# Patient Record
Sex: Female | Born: 1965 | Race: Black or African American | Hispanic: No | Smoking: Never smoker
Health system: Southern US, Community
[De-identification: ages and names within clinical notes are randomized; demographics above are authoritative.]

## PROBLEM LIST (undated history)

## (undated) DIAGNOSIS — D649 Anemia, unspecified: Secondary | ICD-10-CM

## (undated) DIAGNOSIS — B20 Human immunodeficiency virus [HIV] disease: Secondary | ICD-10-CM

## (undated) DIAGNOSIS — E079 Disorder of thyroid, unspecified: Secondary | ICD-10-CM

## (undated) DIAGNOSIS — Z21 Asymptomatic human immunodeficiency virus [HIV] infection status: Secondary | ICD-10-CM

## (undated) HISTORY — DX: Anemia, unspecified: D64.9

## (undated) HISTORY — DX: Disorder of thyroid, unspecified: E07.9

## (undated) HISTORY — DX: Human immunodeficiency virus (HIV) disease: B20

## (undated) HISTORY — DX: Asymptomatic human immunodeficiency virus (hiv) infection status: Z21

---

## 1998-05-15 ENCOUNTER — Other Ambulatory Visit: Admission: RE | Admit: 1998-05-15 | Discharge: 1998-05-15 | Payer: Self-pay | Admitting: Obstetrics & Gynecology

## 1998-11-28 ENCOUNTER — Inpatient Hospital Stay (HOSPITAL_COMMUNITY): Admission: AD | Admit: 1998-11-28 | Discharge: 1998-12-01 | Payer: Self-pay | Admitting: Obstetrics & Gynecology

## 1999-01-18 ENCOUNTER — Other Ambulatory Visit: Admission: RE | Admit: 1999-01-18 | Discharge: 1999-01-18 | Payer: Self-pay | Admitting: Obstetrics & Gynecology

## 1999-03-25 ENCOUNTER — Other Ambulatory Visit: Admission: RE | Admit: 1999-03-25 | Discharge: 1999-03-25 | Payer: Self-pay | Admitting: Obstetrics & Gynecology

## 1999-03-25 ENCOUNTER — Encounter (INDEPENDENT_AMBULATORY_CARE_PROVIDER_SITE_OTHER): Payer: Self-pay

## 1999-07-13 ENCOUNTER — Other Ambulatory Visit: Admission: RE | Admit: 1999-07-13 | Discharge: 1999-07-13 | Payer: Self-pay | Admitting: Obstetrics & Gynecology

## 2000-08-28 ENCOUNTER — Other Ambulatory Visit: Admission: RE | Admit: 2000-08-28 | Discharge: 2000-08-28 | Payer: Self-pay | Admitting: Obstetrics & Gynecology

## 2001-09-05 ENCOUNTER — Other Ambulatory Visit: Admission: RE | Admit: 2001-09-05 | Discharge: 2001-09-05 | Payer: Self-pay | Admitting: Obstetrics & Gynecology

## 2001-12-07 DIAGNOSIS — B20 Human immunodeficiency virus [HIV] disease: Secondary | ICD-10-CM

## 2001-12-18 ENCOUNTER — Ambulatory Visit (HOSPITAL_COMMUNITY): Admission: RE | Admit: 2001-12-18 | Discharge: 2001-12-18 | Payer: Self-pay | Admitting: Infectious Diseases

## 2001-12-18 ENCOUNTER — Encounter: Payer: Self-pay | Admitting: Infectious Diseases

## 2001-12-18 ENCOUNTER — Encounter: Admission: RE | Admit: 2001-12-18 | Discharge: 2001-12-18 | Payer: Self-pay | Admitting: Infectious Diseases

## 2001-12-31 ENCOUNTER — Encounter (INDEPENDENT_AMBULATORY_CARE_PROVIDER_SITE_OTHER): Payer: Self-pay | Admitting: *Deleted

## 2002-01-02 ENCOUNTER — Encounter: Admission: RE | Admit: 2002-01-02 | Discharge: 2002-01-02 | Payer: Self-pay | Admitting: Infectious Diseases

## 2002-02-06 ENCOUNTER — Encounter: Admission: RE | Admit: 2002-02-06 | Discharge: 2002-02-06 | Payer: Self-pay | Admitting: Infectious Diseases

## 2002-02-22 ENCOUNTER — Encounter: Admission: RE | Admit: 2002-02-22 | Discharge: 2002-02-22 | Payer: Self-pay | Admitting: Infectious Diseases

## 2002-03-11 ENCOUNTER — Ambulatory Visit (HOSPITAL_COMMUNITY): Admission: RE | Admit: 2002-03-11 | Discharge: 2002-03-11 | Payer: Self-pay | Admitting: Infectious Diseases

## 2002-03-11 ENCOUNTER — Encounter: Admission: RE | Admit: 2002-03-11 | Discharge: 2002-03-11 | Payer: Self-pay | Admitting: Infectious Diseases

## 2002-03-20 ENCOUNTER — Encounter: Admission: RE | Admit: 2002-03-20 | Discharge: 2002-03-20 | Payer: Self-pay | Admitting: Internal Medicine

## 2002-03-21 ENCOUNTER — Ambulatory Visit (HOSPITAL_COMMUNITY): Admission: RE | Admit: 2002-03-21 | Discharge: 2002-03-21 | Payer: Self-pay | Admitting: Infectious Diseases

## 2002-03-21 ENCOUNTER — Encounter: Payer: Self-pay | Admitting: Infectious Diseases

## 2002-03-27 ENCOUNTER — Encounter: Admission: RE | Admit: 2002-03-27 | Discharge: 2002-03-27 | Payer: Self-pay | Admitting: Infectious Diseases

## 2002-04-03 ENCOUNTER — Encounter: Admission: RE | Admit: 2002-04-03 | Discharge: 2002-04-03 | Payer: Self-pay | Admitting: Infectious Diseases

## 2002-04-17 ENCOUNTER — Encounter: Admission: RE | Admit: 2002-04-17 | Discharge: 2002-04-17 | Payer: Self-pay | Admitting: Infectious Diseases

## 2002-04-23 ENCOUNTER — Encounter: Admission: RE | Admit: 2002-04-23 | Discharge: 2002-04-23 | Payer: Self-pay | Admitting: Internal Medicine

## 2002-04-23 ENCOUNTER — Ambulatory Visit (HOSPITAL_COMMUNITY): Admission: RE | Admit: 2002-04-23 | Discharge: 2002-04-23 | Payer: Self-pay | Admitting: Infectious Diseases

## 2002-06-10 ENCOUNTER — Ambulatory Visit (HOSPITAL_COMMUNITY): Admission: RE | Admit: 2002-06-10 | Discharge: 2002-06-10 | Payer: Self-pay | Admitting: Infectious Diseases

## 2002-06-10 ENCOUNTER — Encounter: Admission: RE | Admit: 2002-06-10 | Discharge: 2002-06-10 | Payer: Self-pay | Admitting: Infectious Diseases

## 2002-07-10 ENCOUNTER — Encounter: Admission: RE | Admit: 2002-07-10 | Discharge: 2002-07-10 | Payer: Self-pay | Admitting: Infectious Diseases

## 2002-07-10 ENCOUNTER — Ambulatory Visit (HOSPITAL_COMMUNITY): Admission: RE | Admit: 2002-07-10 | Discharge: 2002-07-10 | Payer: Self-pay | Admitting: Infectious Diseases

## 2002-09-12 ENCOUNTER — Encounter: Admission: RE | Admit: 2002-09-12 | Discharge: 2002-09-12 | Payer: Self-pay | Admitting: Infectious Diseases

## 2002-11-13 ENCOUNTER — Encounter: Payer: Self-pay | Admitting: Infectious Diseases

## 2002-11-13 ENCOUNTER — Encounter: Admission: RE | Admit: 2002-11-13 | Discharge: 2002-11-13 | Payer: Self-pay | Admitting: Infectious Diseases

## 2002-12-03 ENCOUNTER — Encounter: Payer: Self-pay | Admitting: Infectious Diseases

## 2002-12-03 ENCOUNTER — Encounter: Admission: RE | Admit: 2002-12-03 | Discharge: 2002-12-03 | Payer: Self-pay | Admitting: Infectious Diseases

## 2003-01-01 ENCOUNTER — Encounter: Admission: RE | Admit: 2003-01-01 | Discharge: 2003-01-01 | Payer: Self-pay | Admitting: Infectious Diseases

## 2003-01-03 ENCOUNTER — Encounter: Payer: Self-pay | Admitting: Infectious Diseases

## 2003-01-03 ENCOUNTER — Ambulatory Visit (HOSPITAL_COMMUNITY): Admission: RE | Admit: 2003-01-03 | Discharge: 2003-01-03 | Payer: Self-pay | Admitting: Infectious Diseases

## 2003-02-12 ENCOUNTER — Ambulatory Visit (HOSPITAL_COMMUNITY): Admission: RE | Admit: 2003-02-12 | Discharge: 2003-02-12 | Payer: Self-pay | Admitting: Infectious Diseases

## 2003-02-12 ENCOUNTER — Encounter: Payer: Self-pay | Admitting: Infectious Diseases

## 2003-02-12 ENCOUNTER — Encounter: Admission: RE | Admit: 2003-02-12 | Discharge: 2003-02-12 | Payer: Self-pay | Admitting: Infectious Diseases

## 2003-05-14 ENCOUNTER — Encounter: Payer: Self-pay | Admitting: Infectious Diseases

## 2003-05-14 ENCOUNTER — Encounter: Admission: RE | Admit: 2003-05-14 | Discharge: 2003-05-14 | Payer: Self-pay | Admitting: Infectious Diseases

## 2003-05-14 ENCOUNTER — Ambulatory Visit (HOSPITAL_COMMUNITY): Admission: RE | Admit: 2003-05-14 | Discharge: 2003-05-14 | Payer: Self-pay | Admitting: Infectious Diseases

## 2003-08-27 ENCOUNTER — Encounter: Payer: Self-pay | Admitting: Infectious Diseases

## 2003-08-27 ENCOUNTER — Encounter: Admission: RE | Admit: 2003-08-27 | Discharge: 2003-08-27 | Payer: Self-pay | Admitting: Infectious Diseases

## 2003-12-23 ENCOUNTER — Other Ambulatory Visit: Admission: RE | Admit: 2003-12-23 | Discharge: 2003-12-23 | Payer: Self-pay | Admitting: Obstetrics and Gynecology

## 2003-12-31 ENCOUNTER — Ambulatory Visit (HOSPITAL_COMMUNITY): Admission: RE | Admit: 2003-12-31 | Discharge: 2003-12-31 | Payer: Self-pay | Admitting: Infectious Diseases

## 2003-12-31 ENCOUNTER — Encounter: Admission: RE | Admit: 2003-12-31 | Discharge: 2003-12-31 | Payer: Self-pay | Admitting: Infectious Diseases

## 2004-04-28 ENCOUNTER — Ambulatory Visit (HOSPITAL_COMMUNITY): Admission: RE | Admit: 2004-04-28 | Discharge: 2004-04-28 | Payer: Self-pay | Admitting: Infectious Diseases

## 2004-04-28 ENCOUNTER — Ambulatory Visit: Payer: Self-pay | Admitting: Infectious Diseases

## 2004-11-03 ENCOUNTER — Ambulatory Visit (HOSPITAL_COMMUNITY): Admission: RE | Admit: 2004-11-03 | Discharge: 2004-11-03 | Payer: Self-pay | Admitting: Infectious Diseases

## 2004-11-03 ENCOUNTER — Ambulatory Visit: Payer: Self-pay | Admitting: Infectious Diseases

## 2005-01-12 ENCOUNTER — Other Ambulatory Visit: Admission: RE | Admit: 2005-01-12 | Discharge: 2005-01-12 | Payer: Self-pay | Admitting: Family Medicine

## 2005-03-02 ENCOUNTER — Encounter (INDEPENDENT_AMBULATORY_CARE_PROVIDER_SITE_OTHER): Payer: Self-pay | Admitting: *Deleted

## 2005-03-02 ENCOUNTER — Ambulatory Visit (HOSPITAL_COMMUNITY): Admission: RE | Admit: 2005-03-02 | Discharge: 2005-03-02 | Payer: Self-pay | Admitting: Infectious Diseases

## 2005-03-02 ENCOUNTER — Ambulatory Visit: Payer: Self-pay | Admitting: Infectious Diseases

## 2005-06-29 ENCOUNTER — Ambulatory Visit: Payer: Self-pay | Admitting: Infectious Diseases

## 2005-06-29 ENCOUNTER — Ambulatory Visit (HOSPITAL_COMMUNITY): Admission: RE | Admit: 2005-06-29 | Discharge: 2005-06-29 | Payer: Self-pay | Admitting: Infectious Diseases

## 2005-06-29 ENCOUNTER — Encounter (INDEPENDENT_AMBULATORY_CARE_PROVIDER_SITE_OTHER): Payer: Self-pay | Admitting: *Deleted

## 2005-06-29 LAB — CONVERTED CEMR LAB
CD4 Count: 360 microliters
HIV 1 RNA Quant: 399 copies/mL

## 2005-10-26 ENCOUNTER — Encounter (INDEPENDENT_AMBULATORY_CARE_PROVIDER_SITE_OTHER): Payer: Self-pay | Admitting: *Deleted

## 2005-10-26 ENCOUNTER — Encounter: Admission: RE | Admit: 2005-10-26 | Discharge: 2005-10-26 | Payer: Self-pay | Admitting: Infectious Diseases

## 2005-10-26 ENCOUNTER — Ambulatory Visit: Payer: Self-pay | Admitting: Infectious Diseases

## 2006-01-31 ENCOUNTER — Other Ambulatory Visit: Admission: RE | Admit: 2006-01-31 | Discharge: 2006-01-31 | Payer: Self-pay | Admitting: Family Medicine

## 2006-03-01 ENCOUNTER — Encounter: Admission: RE | Admit: 2006-03-01 | Discharge: 2006-03-01 | Payer: Self-pay | Admitting: Infectious Diseases

## 2006-03-01 ENCOUNTER — Ambulatory Visit: Payer: Self-pay | Admitting: Infectious Diseases

## 2006-03-08 ENCOUNTER — Ambulatory Visit (HOSPITAL_COMMUNITY): Admission: RE | Admit: 2006-03-08 | Discharge: 2006-03-08 | Payer: Self-pay | Admitting: Infectious Diseases

## 2006-05-04 DIAGNOSIS — J309 Allergic rhinitis, unspecified: Secondary | ICD-10-CM | POA: Insufficient documentation

## 2006-05-04 DIAGNOSIS — D649 Anemia, unspecified: Secondary | ICD-10-CM

## 2006-09-13 ENCOUNTER — Encounter: Payer: Self-pay | Admitting: Infectious Diseases

## 2006-09-13 ENCOUNTER — Ambulatory Visit: Payer: Self-pay | Admitting: Infectious Diseases

## 2006-09-13 ENCOUNTER — Encounter: Admission: RE | Admit: 2006-09-13 | Discharge: 2006-09-13 | Payer: Self-pay | Admitting: Infectious Diseases

## 2006-09-13 LAB — CONVERTED CEMR LAB
AST: 15 units/L (ref 0–37)
Albumin: 4.1 g/dL (ref 3.5–5.2)
Alkaline Phosphatase: 63 units/L (ref 39–117)
BUN: 10 mg/dL (ref 6–23)
CD4 Count: 460 microliters
Creatinine, Ser: 0.77 mg/dL (ref 0.40–1.20)
HDL: 63 mg/dL (ref >39)
Hemoglobin: 11.6 g/dL — ABNORMAL LOW (ref 12.0–15.0)
LDL Cholesterol: 125 mg/dL — ABNORMAL HIGH (ref 0–99)
MCHC: 32 g/dL (ref 30.0–36.0)
MCV: 103.1 fL — ABNORMAL HIGH (ref 78.0–100.0)
Phosphorus: 3.3 mg/dL (ref 2.3–4.6)
Potassium: 4.2 meq/L (ref 3.5–5.3)
RDW: 13.9 % (ref 11.5–14.0)
Total Bilirubin: 0.3 mg/dL (ref 0.3–1.2)
Total CHOL/HDL Ratio: 3.3
VLDL: 18 mg/dL (ref 0–40)

## 2006-09-18 ENCOUNTER — Encounter (INDEPENDENT_AMBULATORY_CARE_PROVIDER_SITE_OTHER): Payer: Self-pay | Admitting: *Deleted

## 2006-09-18 LAB — CONVERTED CEMR LAB

## 2006-10-01 ENCOUNTER — Encounter (INDEPENDENT_AMBULATORY_CARE_PROVIDER_SITE_OTHER): Payer: Self-pay | Admitting: *Deleted

## 2006-11-21 ENCOUNTER — Telehealth: Payer: Self-pay | Admitting: Infectious Diseases

## 2007-02-09 ENCOUNTER — Encounter: Payer: Self-pay | Admitting: Infectious Diseases

## 2007-02-09 ENCOUNTER — Encounter: Payer: Self-pay | Admitting: Endocrinology

## 2007-02-21 ENCOUNTER — Encounter: Payer: Self-pay | Admitting: Infectious Diseases

## 2007-02-21 ENCOUNTER — Other Ambulatory Visit: Admission: RE | Admit: 2007-02-21 | Discharge: 2007-02-21 | Payer: Self-pay | Admitting: Family Medicine

## 2007-03-12 ENCOUNTER — Encounter (INDEPENDENT_AMBULATORY_CARE_PROVIDER_SITE_OTHER): Payer: Self-pay | Admitting: *Deleted

## 2007-03-12 ENCOUNTER — Ambulatory Visit: Payer: Self-pay | Admitting: Infectious Diseases

## 2007-03-12 ENCOUNTER — Encounter: Admission: RE | Admit: 2007-03-12 | Discharge: 2007-03-12 | Payer: Self-pay | Admitting: Infectious Diseases

## 2007-03-12 DIAGNOSIS — E0590 Thyrotoxicosis, unspecified without thyrotoxic crisis or storm: Secondary | ICD-10-CM

## 2007-03-12 LAB — CONVERTED CEMR LAB
ALT: 16 units/L (ref 0–35)
Albumin: 4.1 g/dL (ref 3.5–5.2)
Alkaline Phosphatase: 68 units/L (ref 39–117)
CO2: 24 meq/L (ref 19–32)
Glucose, Bld: 77 mg/dL (ref 70–99)
HIV-1 RNA Quant, Log: <1.7 (ref <1.70)
Platelets: 415 10*3/uL — ABNORMAL HIGH (ref 150–400)
Potassium: 3.9 meq/L (ref 3.5–5.3)
RBC: 3.59 M/uL — ABNORMAL LOW (ref 3.87–5.11)
Sodium: 141 meq/L (ref 135–145)
Total Bilirubin: 0.3 mg/dL (ref 0.3–1.2)
Total Protein: 7.2 g/dL (ref 6.0–8.3)
WBC: 6.3 10*3/uL (ref 4.0–10.5)

## 2007-03-13 ENCOUNTER — Encounter (INDEPENDENT_AMBULATORY_CARE_PROVIDER_SITE_OTHER): Payer: Self-pay | Admitting: *Deleted

## 2007-03-16 ENCOUNTER — Encounter: Payer: Self-pay | Admitting: Infectious Diseases

## 2007-04-11 ENCOUNTER — Ambulatory Visit (HOSPITAL_COMMUNITY): Admission: RE | Admit: 2007-04-11 | Discharge: 2007-04-11 | Payer: Self-pay | Admitting: Family Medicine

## 2007-04-18 ENCOUNTER — Encounter: Payer: Self-pay | Admitting: Infectious Diseases

## 2007-07-24 ENCOUNTER — Encounter (INDEPENDENT_AMBULATORY_CARE_PROVIDER_SITE_OTHER): Payer: Self-pay | Admitting: *Deleted

## 2007-09-11 ENCOUNTER — Encounter: Payer: Self-pay | Admitting: Endocrinology

## 2007-09-17 ENCOUNTER — Encounter: Admission: RE | Admit: 2007-09-17 | Discharge: 2007-09-17 | Payer: Self-pay | Admitting: Infectious Diseases

## 2007-09-17 ENCOUNTER — Ambulatory Visit: Payer: Self-pay | Admitting: Infectious Diseases

## 2007-09-17 LAB — CONVERTED CEMR LAB
HIV 1 RNA Quant: <50 copies/mL (ref <50)
HIV-1 RNA Quant, Log: <1.7 (ref <1.70)

## 2007-09-18 LAB — CONVERTED CEMR LAB
BUN: 12 mg/dL (ref 6–23)
Calcium: 9.4 mg/dL (ref 8.4–10.5)
Cholesterol: 170 mg/dL (ref 0–200)
Creatinine, Ser: 0.6 mg/dL (ref 0.40–1.20)
Glucose, Bld: 89 mg/dL (ref 70–99)
Hemoglobin: 12.1 g/dL (ref 12.0–15.0)
MCHC: 33.4 g/dL (ref 30.0–36.0)
MCV: 98.6 fL (ref 78.0–100.0)
Potassium: 4.2 meq/L (ref 3.5–5.3)
RBC: 3.67 M/uL — ABNORMAL LOW (ref 3.87–5.11)
RDW: 14.6 % (ref 11.5–15.5)
Triglycerides: 78 mg/dL (ref <150)
VLDL: 16 mg/dL (ref 0–40)

## 2007-10-09 ENCOUNTER — Ambulatory Visit: Payer: Self-pay | Admitting: Endocrinology

## 2007-10-15 ENCOUNTER — Encounter: Payer: Self-pay | Admitting: Endocrinology

## 2007-12-21 ENCOUNTER — Encounter: Payer: Self-pay | Admitting: Infectious Diseases

## 2008-01-10 ENCOUNTER — Encounter: Payer: Self-pay | Admitting: Endocrinology

## 2008-02-28 ENCOUNTER — Other Ambulatory Visit: Admission: RE | Admit: 2008-02-28 | Discharge: 2008-02-28 | Payer: Self-pay | Admitting: Family Medicine

## 2008-03-10 ENCOUNTER — Telehealth: Payer: Self-pay | Admitting: Infectious Diseases

## 2008-03-12 ENCOUNTER — Ambulatory Visit: Payer: Self-pay | Admitting: Internal Medicine

## 2008-03-12 ENCOUNTER — Encounter: Admission: RE | Admit: 2008-03-12 | Discharge: 2008-03-12 | Payer: Self-pay | Admitting: Internal Medicine

## 2008-03-12 DIAGNOSIS — R519 Headache, unspecified: Secondary | ICD-10-CM | POA: Insufficient documentation

## 2008-03-12 DIAGNOSIS — R51 Headache: Secondary | ICD-10-CM | POA: Insufficient documentation

## 2008-03-12 LAB — CONVERTED CEMR LAB
AST: 15 units/L (ref 0–37)
Albumin: 4.3 g/dL (ref 3.5–5.2)
BUN: 10 mg/dL (ref 6–23)
Basophils Relative: 1 % (ref 0–1)
CO2: 22 meq/L (ref 19–32)
Calcium: 9.4 mg/dL (ref 8.4–10.5)
Chloride: 104 meq/L (ref 96–112)
Creatinine, Ser: 0.64 mg/dL (ref 0.40–1.20)
HIV 1 RNA Quant: 59 copies/mL — ABNORMAL HIGH (ref <50)
Hemoglobin: 11.8 g/dL — ABNORMAL LOW (ref 12.0–15.0)
Lymphocytes Relative: 42 % (ref 12–46)
Lymphs Abs: 2.4 10*3/uL (ref 0.7–4.0)
MCHC: 32 g/dL (ref 30.0–36.0)
Monocytes Absolute: 0.4 10*3/uL (ref 0.1–1.0)
Monocytes Relative: 8 % (ref 3–12)
Neutro Abs: 2.5 10*3/uL (ref 1.7–7.7)
Neutrophils Relative %: 43 % (ref 43–77)
Potassium: 4.3 meq/L (ref 3.5–5.3)
RBC: 3.67 M/uL — ABNORMAL LOW (ref 3.87–5.11)
TSH: 0.013 microintl units/mL — ABNORMAL LOW (ref 0.350–4.50)
WBC: 5.8 10*3/uL (ref 4.0–10.5)

## 2008-04-02 ENCOUNTER — Encounter (INDEPENDENT_AMBULATORY_CARE_PROVIDER_SITE_OTHER): Payer: Self-pay | Admitting: *Deleted

## 2008-04-11 ENCOUNTER — Ambulatory Visit (HOSPITAL_COMMUNITY): Admission: RE | Admit: 2008-04-11 | Discharge: 2008-04-11 | Payer: Self-pay | Admitting: Family Medicine

## 2008-04-30 ENCOUNTER — Encounter: Payer: Self-pay | Admitting: Endocrinology

## 2008-05-06 ENCOUNTER — Ambulatory Visit: Payer: Self-pay | Admitting: Endocrinology

## 2008-05-14 ENCOUNTER — Encounter: Payer: Self-pay | Admitting: Endocrinology

## 2008-05-20 ENCOUNTER — Encounter: Payer: Self-pay | Admitting: Endocrinology

## 2008-05-22 ENCOUNTER — Telehealth (INDEPENDENT_AMBULATORY_CARE_PROVIDER_SITE_OTHER): Payer: Self-pay | Admitting: *Deleted

## 2008-07-21 ENCOUNTER — Encounter: Payer: Self-pay | Admitting: Endocrinology

## 2008-09-10 ENCOUNTER — Ambulatory Visit: Payer: Self-pay | Admitting: Infectious Diseases

## 2008-09-10 LAB — CONVERTED CEMR LAB
ALT: 14 units/L (ref 0–35)
AST: 15 units/L (ref 0–37)
Alkaline Phosphatase: 82 units/L (ref 39–117)
Basophils Absolute: 0 10*3/uL (ref 0.0–0.1)
Basophils Relative: 0 % (ref 0–1)
Creatinine, Ser: 0.62 mg/dL (ref 0.40–1.20)
Eosinophils Relative: 9 % — ABNORMAL HIGH (ref 0–5)
HCT: 39 % (ref 36.0–46.0)
HIV 1 RNA Quant: 53 copies/mL — ABNORMAL HIGH (ref <48)
Hemoglobin: 13 g/dL (ref 12.0–15.0)
LDL Cholesterol: 127 mg/dL — ABNORMAL HIGH (ref 0–99)
MCHC: 33.3 g/dL (ref 30.0–36.0)
Monocytes Absolute: 0.4 10*3/uL (ref 0.1–1.0)
Neutro Abs: 2.4 10*3/uL (ref 1.7–7.7)
Platelets: 424 10*3/uL — ABNORMAL HIGH (ref 150–400)
RDW: 14.6 % (ref 11.5–15.5)
Sodium: 141 meq/L (ref 135–145)
Total Bilirubin: 0.3 mg/dL (ref 0.3–1.2)
Total CHOL/HDL Ratio: 2.9
VLDL: 13 mg/dL (ref 0–40)

## 2008-10-23 ENCOUNTER — Encounter: Payer: Self-pay | Admitting: Endocrinology

## 2008-10-31 ENCOUNTER — Ambulatory Visit: Payer: Self-pay | Admitting: Endocrinology

## 2008-10-31 DIAGNOSIS — E890 Postprocedural hypothyroidism: Secondary | ICD-10-CM

## 2008-12-18 ENCOUNTER — Encounter: Payer: Self-pay | Admitting: Internal Medicine

## 2008-12-19 ENCOUNTER — Telehealth: Payer: Self-pay | Admitting: Endocrinology

## 2008-12-24 ENCOUNTER — Ambulatory Visit: Payer: Self-pay | Admitting: Internal Medicine

## 2009-02-25 ENCOUNTER — Encounter: Payer: Self-pay | Admitting: Internal Medicine

## 2009-02-26 ENCOUNTER — Telehealth: Payer: Self-pay | Admitting: Internal Medicine

## 2009-03-03 ENCOUNTER — Other Ambulatory Visit: Admission: RE | Admit: 2009-03-03 | Discharge: 2009-03-03 | Payer: Self-pay | Admitting: Family Medicine

## 2009-03-23 ENCOUNTER — Ambulatory Visit: Payer: Self-pay | Admitting: Infectious Diseases

## 2009-03-23 LAB — CONVERTED CEMR LAB
Alkaline Phosphatase: 68 units/L (ref 39–117)
Basophils Absolute: 0.1 10*3/uL (ref 0.0–0.1)
Basophils Relative: 1 % (ref 0–1)
Glucose, Bld: 85 mg/dL (ref 70–99)
HIV-1 RNA Quant, Log: 1.89 — ABNORMAL HIGH (ref <1.68)
Hemoglobin: 12 g/dL (ref 12.0–15.0)
MCHC: 34 g/dL (ref 30.0–36.0)
Monocytes Absolute: 0.5 10*3/uL (ref 0.1–1.0)
Neutro Abs: 3.4 10*3/uL (ref 1.7–7.7)
Neutrophils Relative %: 48 % (ref 43–77)
Platelets: 441 10*3/uL — ABNORMAL HIGH (ref 150–400)
RDW: 13.1 % (ref 11.5–15.5)
Sodium: 141 meq/L (ref 135–145)
T3, Total: 98.1 ng/dL (ref >=80.0)
TSH: 0.265 microintl units/mL — ABNORMAL LOW (ref 0.350–4.5)
Total Bilirubin: 0.3 mg/dL (ref 0.3–1.2)
Total Protein: 7.1 g/dL (ref 6.0–8.3)

## 2009-03-27 ENCOUNTER — Telehealth: Payer: Self-pay | Admitting: Internal Medicine

## 2009-04-14 ENCOUNTER — Ambulatory Visit (HOSPITAL_COMMUNITY): Admission: RE | Admit: 2009-04-14 | Discharge: 2009-04-14 | Payer: Self-pay | Admitting: Family Medicine

## 2009-06-08 ENCOUNTER — Encounter (INDEPENDENT_AMBULATORY_CARE_PROVIDER_SITE_OTHER): Payer: Self-pay | Admitting: *Deleted

## 2009-08-26 ENCOUNTER — Encounter (INDEPENDENT_AMBULATORY_CARE_PROVIDER_SITE_OTHER): Payer: Self-pay | Admitting: *Deleted

## 2009-11-30 ENCOUNTER — Ambulatory Visit: Payer: Self-pay | Admitting: Infectious Diseases

## 2009-11-30 LAB — CONVERTED CEMR LAB
ALT: 19 units/L (ref 0–35)
AST: 23 units/L (ref 0–37)
Albumin: 4.4 g/dL (ref 3.5–5.2)
Calcium: 9.7 mg/dL (ref 8.4–10.5)
Chloride: 104 meq/L (ref 96–112)
HCT: 37.6 % (ref 36.0–46.0)
HIV 1 RNA Quant: <48 copies/mL (ref <48)
LDL Cholesterol: 138 mg/dL — ABNORMAL HIGH (ref 0–99)
Lymphocytes Relative: 37 % (ref 12–46)
Lymphs Abs: 2.5 10*3/uL (ref 0.7–4.0)
Neutro Abs: 3.3 10*3/uL (ref 1.7–7.7)
Neutrophils Relative %: 50 % (ref 43–77)
Platelets: 420 10*3/uL — ABNORMAL HIGH (ref 150–400)
Potassium: 3.8 meq/L (ref 3.5–5.3)
Total CHOL/HDL Ratio: 3.3
WBC: 6.6 10*3/uL (ref 4.0–10.5)

## 2009-12-23 ENCOUNTER — Encounter: Payer: Self-pay | Admitting: Infectious Diseases

## 2010-03-02 ENCOUNTER — Other Ambulatory Visit: Admission: RE | Admit: 2010-03-02 | Discharge: 2010-03-02 | Payer: Self-pay | Admitting: Family Medicine

## 2010-04-16 ENCOUNTER — Ambulatory Visit (HOSPITAL_COMMUNITY): Admission: RE | Admit: 2010-04-16 | Discharge: 2010-04-16 | Payer: Self-pay | Admitting: Family Medicine

## 2010-06-15 ENCOUNTER — Encounter (INDEPENDENT_AMBULATORY_CARE_PROVIDER_SITE_OTHER): Payer: Self-pay | Admitting: *Deleted

## 2010-08-15 ENCOUNTER — Encounter: Payer: Self-pay | Admitting: Obstetrics and Gynecology

## 2010-08-15 ENCOUNTER — Encounter: Payer: Self-pay | Admitting: Endocrinology

## 2010-08-22 LAB — CONVERTED CEMR LAB: Pap Smear: NORMAL

## 2010-08-26 NOTE — Miscellaneous (Signed)
  Clinical Lists Changes  Observations: Added new observation of YEARAIDSPOS: 2003  (06/15/2010 15:41)

## 2010-08-26 NOTE — Assessment & Plan Note (Signed)
Summary: FU VISIT/DS   Referring Provider:  dr c white Primary Provider:  Aram Beecham white  CC:  follow-up visit.  History of Present Illness: 45 yo F with HIV+ and Graves disease. Recieved I-131 (07-21-08) rx for hyperthyroidism.  Last CD4 620 VL 78  (03-23-09).  has been doing well, no problems.   Preventive Screening-Counseling & Management  Alcohol-Tobacco     Alcohol drinks/day: 0     Smoking Status: never  Caffeine-Diet-Exercise     Caffeine use/day: coffee, tea sometimes     Does Patient Exercise: yes     Type of exercise: gym membership     Exercise (avg: min/session): >60     Times/week: 3  Safety-Violence-Falls     Seat Belt Use: yes  Current Medications (verified): 1)  Combivir 150-300 Mg Tabs (Lamivudine-Zidovudine) .... Take 1 Tablet By Mouth Two Times A Day 2)  Viread 300 Mg Tabs (Tenofovir Disoproxil Fumarate) .... One Daily 3)  Sustiva 600 Mg Tabs (Efavirenz) .... One At [facility] 4)  Allegra-D 12 Hour 60-120 Mg Tb12 (Fexofenadine-Pseudoephedrine) .... One Twice A Day 5)  Levothroid 175 Mcg Tabs (Levothyroxine Sodium) .Marland Kitchen.. 1 By Mouth Once Daily  Allergies: 1)  ! Cipro    Updated Prior Medication List: COMBIVIR 150-300 MG TABS (LAMIVUDINE-ZIDOVUDINE) Take 1 tablet by mouth two times a day VIREAD 300 MG TABS (TENOFOVIR DISOPROXIL FUMARATE) one daily SUSTIVA 600 MG TABS (EFAVIRENZ) one at HS ALLEGRA-D 12 HOUR 60-120 MG TB12 (FEXOFENADINE-PSEUDOEPHEDRINE) one twice a day LEVOTHROID 175 MCG TABS (LEVOTHYROXINE SODIUM) 1 by mouth once daily  Current Allergies (reviewed today): ! CIPRO Review of Systems       has been working out at gym. wearing seat belt.   Vital Signs:  Patient profile:   45 year old female Height:      68 inches (172.72 cm) Weight:      172.8 pounds (78.55 kg) BMI:     26.37 Temp:     98.0 degrees F (36.67 degrees C) oral Pulse rate:   93 / minute BP sitting:   123 / 77  (left arm)  Vitals Entered By: Baxter Hire) (Nov 30, 2009  11:02 AM) CC: follow-up visit Pain Assessment Patient in pain? no      Nutritional Status BMI of 25 - 29 = overweight Nutritional Status Detail appetite is good per patient  Have you ever been in a relationship where you felt threatened, hurt or afraid?No   Does patient need assistance? Functional Status Self care Ambulation Normal        Medication Adherence: 11/30/2009   Adherence to medications reviewed with patient. Counseling to provide adequate adherence provided   Prevention For Positives: 11/30/2009   Safe sex practices discussed with patient. Condoms offered.                             Physical Exam  General:  well-developed, well-nourished, and well-hydrated.   Eyes:  pupils equal, pupils round, and pupils reactive to light.   Mouth:  pharynx pink and moist and no exudates.   Neck:  no masses.   Lungs:  normal respiratory effort and normal breath sounds.   Heart:  normal rate, regular rhythm, and no murmur.   Abdomen:  soft, non-tender, and normal bowel sounds.     Impression & Recommendations:  Problem # 1:  HIV DISEASE (ICD-042)  she is doing very well. will cont her curtent meds until quad pill  comes out and hopefully she can start that. will check her labs today. she will f/u with her gyn re: PAP and Mammo which are due in next 2 months. given condoms.   Orders: Est. Patient Level IV (74259)  Other Orders: T-CD4SP ([facility]) (CD4SP) T-HIV Viral Load [phone]) T-Comprehensive Metabolic Panel [id number]) T-Lipid Profile [id number]) T-CBC w/Diff [phone]) T-RPR (Syphilis) [phone])

## 2010-08-26 NOTE — Miscellaneous (Signed)
Summary: RW Update  Clinical Lists Changes  Observations: Added new observation of HIV STATUS: CDC-defined AIDS (08/26/2009 16:42)

## 2010-09-01 ENCOUNTER — Ambulatory Visit (INDEPENDENT_AMBULATORY_CARE_PROVIDER_SITE_OTHER): Payer: PRIVATE HEALTH INSURANCE | Admitting: Infectious Diseases

## 2010-09-01 ENCOUNTER — Encounter: Payer: Self-pay | Admitting: Infectious Diseases

## 2010-09-01 ENCOUNTER — Other Ambulatory Visit: Payer: Self-pay | Admitting: Infectious Diseases

## 2010-09-01 DIAGNOSIS — B20 Human immunodeficiency virus [HIV] disease: Secondary | ICD-10-CM

## 2010-09-01 LAB — CONVERTED CEMR LAB
AST: 20 units/L (ref 0–37)
BUN: 13 mg/dL (ref 6–23)
Basophils Absolute: 0 10*3/uL (ref 0.0–0.1)
Calcium: 9.3 mg/dL (ref 8.4–10.5)
Chloride: 103 meq/L (ref 96–112)
Creatinine, Ser: 0.7 mg/dL (ref 0.40–1.20)
Eosinophils Relative: 10 % — ABNORMAL HIGH (ref 0–5)
Glucose, Bld: 96 mg/dL (ref 70–99)
HCT: 37.5 % (ref 36.0–46.0)
HDL: 56 mg/dL (ref >39)
Hemoglobin: 12.3 g/dL (ref 12.0–15.0)
LDL Cholesterol: 118 mg/dL — ABNORMAL HIGH (ref 0–99)
Lymphocytes Relative: 50 % — ABNORMAL HIGH (ref 12–46)
Lymphs Abs: 3.5 10*3/uL (ref 0.7–4.0)
Monocytes Absolute: 0.4 10*3/uL (ref 0.1–1.0)
Monocytes Relative: 6 % (ref 3–12)
RBC: 3.71 M/uL — ABNORMAL LOW (ref 3.87–5.11)
RDW: 12.9 % (ref 11.5–15.5)
Total CHOL/HDL Ratio: 3.7
VLDL: 33 mg/dL (ref 0–40)

## 2010-09-02 ENCOUNTER — Encounter: Payer: Self-pay | Admitting: Infectious Diseases

## 2010-09-02 LAB — CONVERTED CEMR LAB: HIV 1 RNA Quant: <20 copies/mL (ref <20)

## 2010-09-03 LAB — T-HELPER CELL (CD4) - (RCID CLINIC ONLY)
CD4 % Helper T Cell: 27 % — ABNORMAL LOW (ref 33–55)
CD4 T Cell Abs: 1010 uL (ref 400–2700)

## 2010-09-09 NOTE — Assessment & Plan Note (Signed)
Summary: f/u   Vital Signs:  Patient profile:   45 year old female Height:      68 inches (172.72 cm) Weight:      161.8 pounds (73.55 kg) BMI:     24.69 Temp:     98.4 degrees F (36.89 degrees C) oral Pulse rate:   88 / minute BP sitting:   124 / 82  (left arm)  Vitals Entered By: [name] CMA Duncan Dull) (September 01, 2010 4:40 PM) CC: follow-up visit and labs Is Patient Diabetic? No Pain Assessment Patient in pain? no      Nutritional Status BMI of 19 -24 = normal Nutritional Status Detail appetite " good"  Have you ever been in a relationship where you felt threatened, hurt or afraid?No   Does patient need assistance? Functional Status Self care Ambulation Normal Comments no missed doses of meds per pt.   Referring Provider:  dr c white Primary Provider:  Aram Beecham white  CC:  follow-up visit and labs.  History of Present Illness: 45 yo F with HIV+ and Graves disease. Recieved I-131 (07-21-08) rx for hyperthyroidism.  Last CD4 610 VL <48  (5-11).   Preventive Screening-Counseling & Management  Alcohol-Tobacco     Alcohol drinks/day: 0     Smoking Status: never  Caffeine-Diet-Exercise     Caffeine use/day: coffee, tea sometimes     Does Patient Exercise: yes     Type of exercise: gym membership     Exercise (avg: min/session): >60     Times/week: 3  Safety-Violence-Falls     Seat Belt Use: yes      Drug Use:  no.    Comments: pt. given condoms  Current Medications (verified): 1)  Combivir 150-300 Mg Tabs (Lamivudine-Zidovudine) .... Take 1 Tablet By Mouth Two Times A Day 2)  Viread 300 Mg Tabs (Tenofovir Disoproxil Fumarate) .... One Daily 3)  Sustiva 600 Mg Tabs (Efavirenz) .... One At [facility] 4)  Allegra-D 12 Hour 60-120 Mg Tb12 (Fexofenadine-Pseudoephedrine) .... One Twice A Day 5)  Levothroid 175 Mcg Tabs (Levothyroxine Sodium) .Marland Kitchen.. 1 By Mouth Once Daily  Allergies: 1)  ! Cipro  Past History:  Past medical, surgical, family and social  histories (including risk factors) reviewed, and no changes noted (except as noted below).  Past Medical History: Reviewed history from 03/23/2009 and no changes required. Allergic rhinitis HIV disease Anemia-NOS Graves disease  Family History: Reviewed history from 10/09/2007 and no changes required. no thyroid dz  Social History: Reviewed history from 10/09/2007 and no changes required. Never Smoked Alcohol use-no Drug use-no widowed works for [employer], seated in front of a computer  Review of Systems       lost 11#.   Physical Exam  General:  well-developed, well-nourished, and well-hydrated.   Eyes:  pupils equal, pupils round, and pupils reactive to light.   Mouth:  pharynx pink and moist and no exudates.   Neck:  no masses.   Lungs:  normal respiratory effort and normal breath sounds.   Heart:  normal rate, regular rhythm, and no murmur.   Abdomen:  soft, non-tender, and normal bowel sounds.     Impression & Recommendations:  Problem # 1:  HIV DISEASE (ICD-042)  whe is doing well clinically. encouraged her to continue to exercise. given condoms. refuses flu shot. will see her back in 6 months. will check her rountine labs today. she will have PAP smear and Mammo with PMD (May or June).   Orders:  Est. Patient Level III [phone])  Other Orders: T-CD4SP ([facility]) (CD4SP) T-HIV Viral Load [phone]) T-Comprehensive Metabolic Panel [phone]) T-Lipid Profile [phone]) T-CBC w/Diff [phone]) T-RPR (Syphilis) [phone])    Orders Added: 1)  T-CD4SP ([facility]) [CD4SP] 2)  T-HIV Viral Load [phone] 3)  T-Comprehensive Metabolic Panel [[id number]] 4)  T-Lipid Profile [[id number]] 5)  T-CBC w/Diff [[id number]] 6)  T-RPR (Syphilis) [[id number]] 7)  Est. Patient Level III [16606]   Not Administered:    Influenza Vaccine not given due to: declined         Medication Adherence: 09/01/2010   Adherence to medications  reviewed with patient. Counseling to provide adequate adherence provided   Prevention For Positives: 09/01/2010   Safe sex practices discussed with patient. Condoms offered.                                   Medication Adherence: 09/01/2010   Adherence to medications reviewed with patient. Counseling to provide adequate adherence provided    Prevention For Positives: 09/01/2010   Safe sex practices discussed with patient. Condoms offered.                             Preventive Care Screening  Pap Smear:    Date:  12/23/2009    Results:  normal

## 2010-10-30 LAB — T-HELPER CELL (CD4) - (RCID CLINIC ONLY)
CD4 % Helper T Cell: 25 % — ABNORMAL LOW (ref 33–55)
CD4 T Cell Abs: 620 uL (ref 400–2700)

## 2010-11-09 LAB — T-HELPER CELL (CD4) - (RCID CLINIC ONLY): CD4 % Helper T Cell: 26 % — ABNORMAL LOW (ref 33–55)

## 2011-03-24 ENCOUNTER — Other Ambulatory Visit (HOSPITAL_COMMUNITY)
Admission: RE | Admit: 2011-03-24 | Discharge: 2011-03-24 | Disposition: A | Discharge status: Home / Self Care | Payer: PRIVATE HEALTH INSURANCE | Source: Ambulatory Visit | Attending: Family Medicine | Admitting: Family Medicine

## 2011-03-24 ENCOUNTER — Other Ambulatory Visit: Payer: Self-pay | Admitting: Physician Assistant

## 2011-03-24 DIAGNOSIS — Z0000 Encounter for general adult medical examination without abnormal findings: Secondary | ICD-10-CM | POA: Insufficient documentation

## 2011-04-21 ENCOUNTER — Other Ambulatory Visit (HOSPITAL_COMMUNITY): Payer: Self-pay | Admitting: Obstetrics and Gynecology

## 2011-04-21 ENCOUNTER — Other Ambulatory Visit (HOSPITAL_COMMUNITY): Payer: Self-pay | Admitting: Family Medicine

## 2011-04-21 DIAGNOSIS — Z1231 Encounter for screening mammogram for malignant neoplasm of breast: Secondary | ICD-10-CM

## 2011-04-26 ENCOUNTER — Ambulatory Visit (HOSPITAL_COMMUNITY)
Admission: RE | Admit: 2011-04-26 | Discharge: 2011-04-26 | Disposition: A | Discharge status: Home / Self Care | Payer: PRIVATE HEALTH INSURANCE | Source: Ambulatory Visit | Attending: Family Medicine | Admitting: Family Medicine

## 2011-04-26 DIAGNOSIS — Z1231 Encounter for screening mammogram for malignant neoplasm of breast: Secondary | ICD-10-CM | POA: Insufficient documentation

## 2011-04-27 ENCOUNTER — Encounter: Payer: Self-pay | Admitting: Infectious Diseases

## 2011-04-27 ENCOUNTER — Ambulatory Visit (INDEPENDENT_AMBULATORY_CARE_PROVIDER_SITE_OTHER): Payer: PRIVATE HEALTH INSURANCE | Admitting: Infectious Diseases

## 2011-04-27 VITALS — BP 135/89 | HR 101 | Temp 97.8°F | Ht 68.0 in | Wt 171.0 lb

## 2011-04-27 DIAGNOSIS — E890 Postprocedural hypothyroidism: Secondary | ICD-10-CM

## 2011-04-27 DIAGNOSIS — B20 Human immunodeficiency virus [HIV] disease: Secondary | ICD-10-CM

## 2011-04-27 LAB — COMPREHENSIVE METABOLIC PANEL
ALT: 17 U/L (ref 0–35)
Alkaline Phosphatase: 74 U/L (ref 39–117)
Sodium: 138 mEq/L (ref 135–145)
Total Bilirubin: 0.2 mg/dL — ABNORMAL LOW (ref 0.3–1.2)
Total Protein: 6.9 g/dL (ref 6.0–8.3)

## 2011-04-27 LAB — CBC
MCH: 33.2 pg (ref 26.0–34.0)
MCV: 101.2 fL — ABNORMAL HIGH (ref 78.0–100.0)
Platelets: 435 10*3/uL — ABNORMAL HIGH (ref 150–400)
RBC: 3.46 MIL/uL — ABNORMAL LOW (ref 3.87–5.11)
RDW: 14.5 % (ref 11.5–15.5)

## 2011-04-27 NOTE — Assessment & Plan Note (Signed)
She continues to f/u with pcp. She gets her thyroid lab checked at work as well. States her last was slightly elevated but not worrisome.

## 2011-04-27 NOTE — Assessment & Plan Note (Signed)
She is doing very well. Will continue her current rx's, she asks about new med developments. 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 She does not wish to change her meds at this time regardless. Will check her labs today, she and i will correspond by email with results. Return to clinic 5-6 months. Offered condoms, refused. offered flu shot refused.

## 2011-04-27 NOTE — Progress Notes (Signed)
  Subjective:    Patient ID: JAQUANDA WICKERSHAM, female    DOB: [birth year], 45 y.o.   MRN: [id number]  HPI 45 yo F with HIV+ and Graves disease. Recieved I-131 (07-21-08) rx for hyperthyroidism. Last CD4 1010 VL <20, Chol 207  (09-01-10).  Has been having some problems with her L hip. Has seen chiropracter, been given 2 courses of prednisone. Is about to have MRI from her PCP.    Review of Systems  Constitutional: Positive for activity change. Negative for appetite change and unexpected weight change.  Respiratory: Negative for cough and shortness of breath.   Gastrointestinal: Negative for diarrhea and constipation.  Genitourinary: Negative for dysuria.  Musculoskeletal: Positive for arthralgias.       Objective:   Physical Exam  Constitutional: She appears well-developed and well-nourished.  Eyes: EOM are normal. Pupils are equal, round, and reactive to light.  Neck: Neck supple.  Cardiovascular: Normal rate, regular rhythm and normal heart sounds.   Pulmonary/Chest: Effort normal and breath sounds normal. She has no wheezes.  Abdominal: Soft. Bowel sounds are normal. There is no tenderness.  Lymphadenopathy:    She has no cervical adenopathy.          Assessment & Plan:

## 2011-05-04 ENCOUNTER — Ambulatory Visit
Admission: RE | Admit: 2011-05-04 | Discharge: 2011-05-04 | Disposition: A | Discharge status: Home / Self Care | Payer: PRIVATE HEALTH INSURANCE | Source: Ambulatory Visit | Attending: Family Medicine | Admitting: Family Medicine

## 2011-05-04 ENCOUNTER — Other Ambulatory Visit: Payer: Self-pay | Admitting: Family Medicine

## 2011-05-04 DIAGNOSIS — M545 Low back pain: Secondary | ICD-10-CM

## 2011-05-06 LAB — T-HELPER CELL (CD4) - (RCID CLINIC ONLY): CD4 % Helper T Cell: 20 — ABNORMAL LOW

## 2011-05-24 ENCOUNTER — Other Ambulatory Visit: Payer: Self-pay | Admitting: *Deleted

## 2011-05-24 DIAGNOSIS — B20 Human immunodeficiency virus [HIV] disease: Secondary | ICD-10-CM

## 2011-05-24 MED ORDER — TENOFOVIR DISOPROXIL FUMARATE 300 MG PO TABS: 300.0000 mg | ORAL_TABLET | Freq: Every day | ORAL | Status: DC

## 2011-05-24 MED ORDER — LAMIVUDINE-ZIDOVUDINE 150-300 MG PO TABS: 1.0000 | ORAL_TABLET | Freq: Two times a day (BID) | ORAL | Status: DC

## 2011-05-24 MED ORDER — EFAVIRENZ 600 MG PO TABS: 600.0000 mg | ORAL_TABLET | Freq: Every day | ORAL | Status: DC

## 2011-05-26 ENCOUNTER — Other Ambulatory Visit: Payer: Self-pay | Admitting: Neurosurgery

## 2011-05-26 DIAGNOSIS — M79606 Pain in leg, unspecified: Secondary | ICD-10-CM

## 2011-05-26 DIAGNOSIS — M549 Dorsalgia, unspecified: Secondary | ICD-10-CM

## 2011-05-27 ENCOUNTER — Other Ambulatory Visit: Payer: Self-pay | Admitting: Neurosurgery

## 2011-05-27 ENCOUNTER — Other Ambulatory Visit: Payer: PRIVATE HEALTH INSURANCE

## 2011-05-27 ENCOUNTER — Ambulatory Visit
Admission: RE | Admit: 2011-05-27 | Discharge: 2011-05-27 | Disposition: A | Discharge status: Home / Self Care | Payer: PRIVATE HEALTH INSURANCE | Source: Ambulatory Visit | Attending: Neurosurgery | Admitting: Neurosurgery

## 2011-05-27 DIAGNOSIS — M79606 Pain in leg, unspecified: Secondary | ICD-10-CM

## 2011-05-27 DIAGNOSIS — M549 Dorsalgia, unspecified: Secondary | ICD-10-CM

## 2011-05-27 MED ORDER — METHYLPREDNISOLONE ACETATE 40 MG/ML INJ SUSP (RADIOLOG
120.0000 mg | Freq: Once | INTRAMUSCULAR | Status: AC
  Administered 2011-05-27: 120 mg via EPIDURAL

## 2011-05-27 MED ORDER — IOHEXOL 180 MG/ML  SOLN
1.0000 mL | Freq: Once | INTRAMUSCULAR | Status: AC | PRN
  Administered 2011-05-27: 1 mL via EPIDURAL

## 2011-05-27 NOTE — Patient Instructions (Signed)

## 2011-12-22 ENCOUNTER — Telehealth: Payer: Self-pay | Admitting: *Deleted

## 2011-12-22 NOTE — Telephone Encounter (Signed)
LM for her to call back & make an appt. Referred to [employer]

## 2011-12-26 ENCOUNTER — Encounter: Payer: Self-pay | Admitting: *Deleted

## 2011-12-28 NOTE — Telephone Encounter (Signed)
Has upcoming appt °

## 2012-02-02 ENCOUNTER — Telehealth: Payer: Self-pay

## 2012-02-02 NOTE — Telephone Encounter (Signed)
Called patient to see if [employer] insurance was still active - left number for her to call back.

## 2012-02-03 ENCOUNTER — Ambulatory Visit (INDEPENDENT_AMBULATORY_CARE_PROVIDER_SITE_OTHER): Payer: PRIVATE HEALTH INSURANCE | Admitting: Infectious Diseases

## 2012-02-03 ENCOUNTER — Encounter: Payer: Self-pay | Admitting: Infectious Diseases

## 2012-02-03 VITALS — BP 134/87 | HR 94 | Temp 98.2°F | Ht 68.0 in | Wt 170.5 lb

## 2012-02-03 DIAGNOSIS — Z23 Encounter for immunization: Secondary | ICD-10-CM

## 2012-02-03 DIAGNOSIS — B20 Human immunodeficiency virus [HIV] disease: Secondary | ICD-10-CM

## 2012-02-03 MED ORDER — FEXOFENADINE-PSEUDOEPHED ER 60-120 MG PO TB12: 1.0000 | ORAL_TABLET | Freq: Two times a day (BID) | ORAL | Status: DC

## 2012-02-03 MED ORDER — LAMIVUDINE-ZIDOVUDINE 150-300 MG PO TABS: 1.0000 | ORAL_TABLET | Freq: Two times a day (BID) | ORAL | Status: DC

## 2012-02-03 MED ORDER — TENOFOVIR DISOPROXIL FUMARATE 300 MG PO TABS: 300.0000 mg | ORAL_TABLET | Freq: Every day | ORAL | Status: DC

## 2012-02-03 MED ORDER — EFAVIRENZ 600 MG PO TABS: 600.0000 mg | ORAL_TABLET | Freq: Every day | ORAL | Status: DC

## 2012-02-03 MED ORDER — LEVOTHYROXINE SODIUM 175 MCG PO TABS: 175.0000 ug | ORAL_TABLET | Freq: Every day | ORAL | Status: DC

## 2012-02-03 NOTE — Assessment & Plan Note (Addendum)
She is doing very well. We spent some time talking about new medication options for her that would be less complicated. 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  She will f/u with her PCP and get mammogram/pap this year.  She will get some labs done at work at no cost (lipids, CMP, CBC) and send [place] the results.  She will rtc in 6 months.  Gets pnvx update

## 2012-02-03 NOTE — Addendum Note (Signed)
Addended by: Starleen Arms D on: 02/03/2012 10:38 AM   Modules accepted: Orders

## 2012-02-03 NOTE — Progress Notes (Signed)
  Subjective:    Patient ID: Brianna Weeks, female    DOB: Jul 21, 1966, 46 y.o.   MRN: [id number]  HPI 46 yo F with HIV+ and Graves disease. Recieved I-131 (07-21-08) rx for hyperthyroidism. Had back surgery (not exactly sure of type) 03-2011 and hip, leg, back pain resolved.  No problems with medications.  HIV 1 RNA Quant (copies/mL)  Date Value  04/27/2011 <20   09/02/2010 <20 copies/mL   11/30/2009 <48 copies/mL      CD4 T Cell Abs (cmm)  Date Value  04/27/2011 950   09/01/2010 1010   11/30/2009 610     Needs PNVX today, needs labs. Has not had PAP this year. Has physical with Dr Laurann Montana coming up.    Review of Systems  Constitutional: Negative for appetite change and unexpected weight change.  Gastrointestinal: Negative for diarrhea and constipation.  Genitourinary: Positive for menstrual problem. Negative for dysuria.       Objective:   Physical Exam  Constitutional: She appears well-developed and well-nourished.  HENT:  Mouth/Throat: No oropharyngeal exudate.  Eyes: EOM are normal. Pupils are equal, round, and reactive to light.       Pterygium L eye.   Neck: Neck supple.  Cardiovascular: Normal rate, regular rhythm and normal heart sounds.   Pulmonary/Chest: Effort normal and breath sounds normal.  Abdominal: Soft. Bowel sounds are normal. There is no tenderness. There is no rebound.  Lymphadenopathy:    She has no cervical adenopathy.          Assessment & Plan:

## 2012-03-27 ENCOUNTER — Other Ambulatory Visit (HOSPITAL_COMMUNITY)
Admission: RE | Admit: 2012-03-27 | Discharge: 2012-03-27 | Disposition: A | Discharge status: Home / Self Care | Payer: PRIVATE HEALTH INSURANCE | Source: Ambulatory Visit | Attending: Family Medicine | Admitting: Family Medicine

## 2012-03-27 ENCOUNTER — Other Ambulatory Visit: Payer: Self-pay | Admitting: Family Medicine

## 2012-03-27 DIAGNOSIS — Z0000 Encounter for general adult medical examination without abnormal findings: Secondary | ICD-10-CM | POA: Insufficient documentation

## 2012-04-06 ENCOUNTER — Telehealth: Payer: Self-pay | Admitting: *Deleted

## 2012-04-06 NOTE — Telephone Encounter (Signed)
Patient requesting that Dr. Ninetta Lights email her lab results from July 2013. [name] CMA

## 2012-04-12 ENCOUNTER — Other Ambulatory Visit (HOSPITAL_COMMUNITY): Payer: Self-pay | Admitting: Family Medicine

## 2012-04-12 DIAGNOSIS — Z1231 Encounter for screening mammogram for malignant neoplasm of breast: Secondary | ICD-10-CM

## 2012-04-26 ENCOUNTER — Ambulatory Visit (HOSPITAL_COMMUNITY): Payer: PRIVATE HEALTH INSURANCE | Attending: Family Medicine

## 2012-05-14 ENCOUNTER — Ambulatory Visit (HOSPITAL_COMMUNITY)
Admission: RE | Admit: 2012-05-14 | Discharge: 2012-05-14 | Disposition: A | Discharge status: Home / Self Care | Payer: PRIVATE HEALTH INSURANCE | Source: Ambulatory Visit | Attending: Family Medicine | Admitting: Family Medicine

## 2012-05-14 DIAGNOSIS — Z1231 Encounter for screening mammogram for malignant neoplasm of breast: Secondary | ICD-10-CM | POA: Insufficient documentation

## 2012-08-29 ENCOUNTER — Telehealth: Payer: Self-pay | Admitting: *Deleted

## 2012-08-29 NOTE — Telephone Encounter (Signed)
Returned call from our office, requesting she make an appointment.  Appointment given 08/31/12 at 9:45 with Dr. Ninetta Lights.  Patient reports she has labs drawn at her office visits. Andree Coss, RN

## 2012-08-31 ENCOUNTER — Encounter: Payer: Self-pay | Admitting: Infectious Diseases

## 2012-08-31 ENCOUNTER — Ambulatory Visit (INDEPENDENT_AMBULATORY_CARE_PROVIDER_SITE_OTHER): Payer: PRIVATE HEALTH INSURANCE | Admitting: Infectious Diseases

## 2012-08-31 VITALS — BP 131/80 | HR 94 | Temp 98.0°F | Ht 68.0 in | Wt 172.0 lb

## 2012-08-31 DIAGNOSIS — E0590 Thyrotoxicosis, unspecified without thyrotoxic crisis or storm: Secondary | ICD-10-CM

## 2012-08-31 DIAGNOSIS — B20 Human immunodeficiency virus [HIV] disease: Secondary | ICD-10-CM

## 2012-08-31 DIAGNOSIS — Z79899 Other long term (current) drug therapy: Secondary | ICD-10-CM

## 2012-08-31 DIAGNOSIS — Z113 Encounter for screening for infections with a predominantly sexual mode of transmission: Secondary | ICD-10-CM

## 2012-08-31 LAB — COMPREHENSIVE METABOLIC PANEL
ALT: 16 U/L (ref 0–35)
AST: 20 U/L (ref 0–37)
Albumin: 4.5 g/dL (ref 3.5–5.2)
Alkaline Phosphatase: 96 U/L (ref 39–117)
BUN: 11 mg/dL (ref 6–23)
CO2: 28 mEq/L (ref 19–32)
Calcium: 9.8 mg/dL (ref 8.4–10.5)
Chloride: 102 mEq/L (ref 96–112)
Creat: 0.82 mg/dL (ref 0.50–1.10)
Glucose, Bld: 73 mg/dL (ref 70–99)
Potassium: 4.3 mEq/L (ref 3.5–5.3)
Sodium: 138 mEq/L (ref 135–145)
Total Bilirubin: 0.2 mg/dL — ABNORMAL LOW (ref 0.3–1.2)
Total Protein: 7.4 g/dL (ref 6.0–8.3)

## 2012-08-31 LAB — LIPID PANEL
Cholesterol: 234 mg/dL — ABNORMAL HIGH (ref 0–200)
HDL: 67 mg/dL
LDL Cholesterol: 152 mg/dL — ABNORMAL HIGH (ref 0–99)
Total CHOL/HDL Ratio: 3.5 ratio
Triglycerides: 73 mg/dL
VLDL: 15 mg/dL (ref 0–40)

## 2012-08-31 LAB — CBC
MCHC: 33.7 g/dL (ref 30.0–36.0)
RDW: 16 % — ABNORMAL HIGH (ref 11.5–15.5)

## 2012-08-31 LAB — T-HELPER CELL (CD4) - (RCID CLINIC ONLY)
CD4 % Helper T Cell: 29 % — ABNORMAL LOW (ref 33–55)
CD4 T Cell Abs: 870 uL (ref 400–2700)

## 2012-08-31 LAB — RPR

## 2012-08-31 MED ORDER — DOLUTEGRAVIR SODIUM 50 MG PO TABS: 50.0000 mg | ORAL_TABLET | Freq: Every day | ORAL | Status: DC

## 2012-08-31 MED ORDER — EFAVIRENZ-EMTRICITAB-TENOFOVIR 600-200-300 MG PO TABS: 1.0000 | ORAL_TABLET | Freq: Every day | ORAL | Status: DC

## 2012-08-31 NOTE — Assessment & Plan Note (Signed)
She appears to be doing well. Refuses flu shot. Offered/refuses condoms. Will change her to atripla/DTGV to make her regimen 2 pills once daily. Will see her back in 2-3 months to assess her tolerance/adherence to new meds. She usually emails me for her lab results within a week.

## 2012-08-31 NOTE — Addendum Note (Signed)
Addended by: Mariea Clonts D on: 08/31/2012 11:25 AM   Modules accepted: Orders

## 2012-08-31 NOTE — Progress Notes (Signed)
  Subjective:    Patient ID: Brianna Weeks, female    DOB: [birth year], 47 y.o.   MRN: [id number]  HPI 47 yo F with HIV+ and Graves disease. Recieved I-131 (07-21-08).  Had back surgery (not exactly sure of type, not in [facility] system) 03-2011 and hip, leg, back pain resolved.  HIV 1 RNA Quant (copies/mL)  Date Value  04/27/2011 <20   09/02/2010 <20 copies/mL   11/30/2009 <48 copies/mL      CD4 T Cell Abs (cmm)  Date Value  04/27/2011 950   09/01/2010 1010   11/30/2009 610     Feels well. 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  Went back to [place] for her dad's funeral last month.  Too cold to go to gym, has gained 7#.  Last PAP July/August of 2013 with PCP.  Has been using moringa tea, wants to know if drug interactions  Review of Systems  Constitutional: Negative for appetite change and unexpected weight change.  Gastrointestinal: Negative for diarrhea and constipation.  Genitourinary: Negative for difficulty urinating.       Objective:   Physical Exam  Constitutional: She appears well-developed and well-nourished.  HENT:  Mouth/Throat: No oropharyngeal exudate.  Eyes: EOM are normal. Pupils are equal, round, and reactive to light.  Neck: Neck supple. No thyromegaly present.  Cardiovascular: Normal rate, regular rhythm and normal heart sounds.   Pulmonary/Chest: Effort normal and breath sounds normal.  Abdominal: Soft. Bowel sounds are normal. There is no tenderness. There is no rebound.  Lymphadenopathy:    She has no cervical adenopathy.       Assessment & Plan:

## 2012-08-31 NOTE — Assessment & Plan Note (Signed)
Appears to be doing well, is asx, has not thyromegally. greatful for PCP f/u.

## 2012-09-04 LAB — HIV-1 RNA QUANT-NO REFLEX-BLD: HIV 1 RNA Quant: <20 copies/mL (ref <20)

## 2012-11-29 ENCOUNTER — Ambulatory Visit: Payer: PRIVATE HEALTH INSURANCE | Admitting: Internal Medicine

## 2012-12-04 ENCOUNTER — Ambulatory Visit (INDEPENDENT_AMBULATORY_CARE_PROVIDER_SITE_OTHER): Payer: PRIVATE HEALTH INSURANCE | Admitting: Internal Medicine

## 2012-12-04 VITALS — BP 138/79 | HR 101 | Temp 97.5°F | Wt 170.0 lb

## 2012-12-04 DIAGNOSIS — Z0000 Encounter for general adult medical examination without abnormal findings: Secondary | ICD-10-CM

## 2012-12-04 DIAGNOSIS — R51 Headache: Secondary | ICD-10-CM

## 2012-12-04 MED ORDER — NAPROXEN 500 MG PO TABS: 500.0000 mg | ORAL_TABLET | Freq: Two times a day (BID) | ORAL | Status: DC

## 2012-12-04 NOTE — Progress Notes (Signed)
[facility] NOTE  RFV: sick visit -> headache Subjective:    Patient ID: Brianna Weeks, female    DOB: [birth year], 47 y.o.   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 She states that she has noticed increased headache throughout the day.  Having headache straight for past 2 wks. With light sensitivity. Not as sensitive to noise. No N/V. Worse at the end of day. No new stressors? Intermittent relief with excedrin. Thought allergy related allegra not working. ? Visual aura. Doesn't drink caffeine and makes jittery. Scale of 1-10. She rates it at an 8 of pain scale. Missed 1/2 a day due symptoms. Bilateral temporal throbbing but left side worse.  No tinnitus,not hearing any swishing or heartbeats.  Having son graduating from high school.   In past only intermittent ha in the past, where tylenol would help  Tolerating her hiv meds without meds  Current Outpatient Prescriptions on File Prior to Visit  Medication Sig Dispense Refill  . Dolutegravir Sodium (TIVICAY) 50 MG TABS Take 1 tablet (50 mg total) by mouth daily.  30 tablet  3  . efavirenz-emtricitabine-tenofovir (ATRIPLA) 600-200-300 MG per tablet Take 1 tablet by mouth at bedtime.  30 tablet  11  . fexofenadine-pseudoephedrine (ALLEGRA-D) 60-120 MG per tablet Take 1 tablet by mouth 2 (two) times daily.  60 tablet  3  . levothyroxine (SYNTHROID, LEVOTHROID) 175 MCG tablet Take 1 tablet (175 mcg total) by mouth daily.  30 tablet  3   No current facility-administered medications on file prior to visit.   Active Ambulatory Problems    Diagnosis Date Noted  . HIV DISEASE 12/07/2001  . HYPERTHYROIDISM 03/12/2007  . 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 ANEMIA-NOS 05/04/2006  . ALLERGIC RHINITIS 05/04/2006  . HEADACHE 03/12/2008   Resolved Ambulatory  Problems    Diagnosis Date Noted  . No Resolved Ambulatory Problems   Past Medical History  Diagnosis Date  . Thyroid disease   . HIV infection   . Anemia    Social and family hx unchanged since last visit   no family hx of aneurysm  Review of Systems   Constitutional: Negative for fever, chills, diaphoresis, activity change, appetite change, fatigue and unexpected weight change.  HENT: Negative for congestion, sore throat, rhinorrhea, sneezing, trouble swallowing and sinus pressure.  Eyes: Negative for photophobia and visual disturbance.  Respiratory: Negative for cough, chest tightness, shortness of breath, wheezing and stridor.  Cardiovascular: Negative for chest pain, palpitations and leg swelling.  Gastrointestinal: Negative for nausea, vomiting, abdominal pain, diarrhea, constipation, blood in stool, abdominal distention and anal bleeding.  Genitourinary: Negative for dysuria, hematuria, flank pain and difficulty urinating.  Musculoskeletal: Negative for myalgias, back pain, joint swelling, arthralgias and gait problem.  Skin: Negative for color change, pallor, rash and wound.  Neurological: Negative for dizziness, tremors, weakness and light-headedness.  Hematological: Negative for adenopathy. Does not bruise/bleed easily.  Psychiatric/Behavioral: Negative for behavioral problems, confusion, sleep disturbance, dysphoric mood, decreased concentration and agitation.       Objective:   Physical Exam BP 138/79  Pulse 101  Temp(Src) 97.5 F (36.4 C) (Oral)  Wt 170 lb (77.111 kg)  BMI 25.85 kg/m2 Physical Exam  Constitutional:  oriented to person, place, and time.  appears well-developed and well-nourished. No distress.  HENT: injected left eye. Mouth/Throat: Oropharynx  is clear and moist. No oropharyngeal exudate.  Cardiovascular: Normal rate, regular rhythm and normal heart sounds. Exam reveals no gallop and no friction rub.  No murmur heard.  Pulmonary/Chest: Effort  normal and breath sounds normal. No respiratory distress.  no wheezes.  Abdominal: Soft. Bowel sounds are normal. He exhibits no distension. There is no tenderness.  Lymphadenopathy:  no cervical adenopathy.  Neurological: alert and oriented to person, place, and time.  Skin: Skin is warm and dry. No rash noted. No erythema.  Psychiatric: normal      Assessment & Plan:  Headache = naproxen BID x 10 days  hiv = continue on current regimen  Vaccinations & tb screening = Joli is applying for a new job that requires evidence of tb screening, varicella, MMR

## 2012-12-05 LAB — MEASLES/MUMPS/RUBELLA IMMUNITY: Rubella: 10 Index — ABNORMAL HIGH (ref <0.90)

## 2012-12-05 LAB — VARICELLA ZOSTER ANTIBODY, IGG: Varicella IgG: 2721 Index — ABNORMAL HIGH (ref <135.00)

## 2012-12-06 LAB — TB SKIN TEST: Induration: >5 mm

## 2012-12-26 ENCOUNTER — Encounter: Payer: Self-pay | Admitting: Infectious Diseases

## 2012-12-26 ENCOUNTER — Ambulatory Visit (INDEPENDENT_AMBULATORY_CARE_PROVIDER_SITE_OTHER): Payer: PRIVATE HEALTH INSURANCE | Admitting: Infectious Diseases

## 2012-12-26 VITALS — BP 129/77 | HR 97 | Temp 98.5°F | Ht 68.5 in | Wt 176.0 lb

## 2012-12-26 DIAGNOSIS — B20 Human immunodeficiency virus [HIV] disease: Secondary | ICD-10-CM

## 2012-12-26 DIAGNOSIS — E0590 Thyrotoxicosis, unspecified without thyrotoxic crisis or storm: Secondary | ICD-10-CM

## 2012-12-26 DIAGNOSIS — R51 Headache: Secondary | ICD-10-CM

## 2012-12-26 MED ORDER — BUTALBITAL-APAP-CAFFEINE 50-325-40 MG PO TABS: 1.0000 | ORAL_TABLET | Freq: Two times a day (BID) | ORAL | Status: DC | PRN

## 2012-12-26 MED ORDER — SUMATRIPTAN SUCCINATE 25 MG PO TABS: 25.0000 mg | ORAL_TABLET | ORAL | Status: DC | PRN

## 2012-12-26 NOTE — Progress Notes (Signed)
  Subjective:    Patient ID: Brianna Weeks, female    DOB: [birth year], 47 y.o.   MRN: [id number]  HPI 47 yo [place] F with HIV+ and Graves disease. Recieved I-131 (07-21-08).  Had back surgery (not exactly sure of type, not in [facility] system) 03-2011. Was seen in ID clinic last month for headaches. Have persisted. Was started on naprosyn but did not help. Pain was severe enough she could not get out of bed to go to work on 5-30. No f/c. Has nausea with episodes. Pounding. Sleepy. Has light and noise aversion. Hasn't been able to exercise due to headaches.  Taking atripla/DTGV.   HIV 1 RNA Quant (copies/mL)  Date Value  08/31/2012 <20   04/27/2011 <20   09/02/2010 <20 copies/mL      CD4 T Cell Abs (cmm)  Date Value  08/31/2012 870   04/27/2011 950   09/01/2010 1010      Review of Systems  Constitutional: Negative for appetite change and unexpected weight change.  Gastrointestinal: Negative for diarrhea and constipation.  Genitourinary: Negative for difficulty urinating.       Objective:   Physical Exam  Constitutional: She appears well-developed and well-nourished.  HENT:  Mouth/Throat: No oropharyngeal exudate.  Eyes: EOM are normal. Pupils are equal, round, and reactive to light. Right conjunctiva is injected. Left conjunctiva is injected.  Neck: Neck supple.  Cardiovascular: Normal rate, regular rhythm and normal heart sounds.   Pulmonary/Chest: Effort normal and breath sounds normal.  Abdominal: Soft. Bowel sounds are normal. There is no tenderness. There is no rebound.  Lymphadenopathy:    She has no cervical adenopathy.          Assessment & Plan:

## 2012-12-26 NOTE — Assessment & Plan Note (Signed)
Continues on thyroxine, greatly appreciate her PCP.

## 2012-12-26 NOTE — Assessment & Plan Note (Signed)
She has multiple sx that suggest migraine. Will try imitrex, fioricet. If not improved will check MRI.

## 2012-12-26 NOTE — Assessment & Plan Note (Signed)
She is doing well. She's offered/refuses condoms. She has f/u appt, labs in September. She has PAP scheduled for September as well.

## 2013-01-18 ENCOUNTER — Other Ambulatory Visit: Payer: Self-pay | Admitting: Infectious Diseases

## 2013-04-10 ENCOUNTER — Ambulatory Visit (INDEPENDENT_AMBULATORY_CARE_PROVIDER_SITE_OTHER): Payer: PRIVATE HEALTH INSURANCE | Admitting: Infectious Diseases

## 2013-04-10 ENCOUNTER — Encounter: Payer: Self-pay | Admitting: Infectious Diseases

## 2013-04-10 VITALS — BP 132/83 | HR 86 | Temp 98.1°F | Ht 68.5 in | Wt 179.0 lb

## 2013-04-10 DIAGNOSIS — E7800 Pure hypercholesterolemia, unspecified: Secondary | ICD-10-CM | POA: Insufficient documentation

## 2013-04-10 DIAGNOSIS — Z23 Encounter for immunization: Secondary | ICD-10-CM

## 2013-04-10 DIAGNOSIS — B20 Human immunodeficiency virus [HIV] disease: Secondary | ICD-10-CM

## 2013-04-10 DIAGNOSIS — E785 Hyperlipidemia, unspecified: Secondary | ICD-10-CM

## 2013-04-10 DIAGNOSIS — R7309 Other abnormal glucose: Secondary | ICD-10-CM

## 2013-04-10 DIAGNOSIS — R7989 Other specified abnormal findings of blood chemistry: Secondary | ICD-10-CM

## 2013-04-10 NOTE — Assessment & Plan Note (Signed)
Encouraged her to avoid sweets, soft drinks, breads. Keep exercising.

## 2013-04-10 NOTE — Progress Notes (Signed)
  Subjective:    Patient ID: Brianna Weeks, female    DOB: 10-15-1965, 47 y.o.   MRN: [id number]  HPI 47 yo [place] F with HIV+ and Graves disease. Recieved I-131 (07-21-08).  Atrpla/DTGV. Has had numbness in her wrist and thumb, from typing. Scheduled for PAP next week.  No problems with synthroid.  Had labs at work HgA1C 6.1%, TSH normal, Chol- 246, Trgi 100, LDL151.  Is doing cardio, weights 3 days/week.   HIV 1 RNA Quant (copies/mL)  Date Value  08/31/2012 <20   04/27/2011 <20   09/02/2010 <20 copies/mL      CD4 T Cell Abs (cmm)  Date Value  08/31/2012 870   04/27/2011 950   09/01/2010 1010     Review of Systems  Constitutional: Negative for fever, chills, appetite change and unexpected weight change.  Gastrointestinal: Negative for diarrhea and constipation.  Genitourinary: Positive for menstrual problem. Negative for dysuria.       Having hot flashes, irregular menses.         Objective:   Physical Exam  Constitutional: She appears well-developed and well-nourished.  HENT:  Mouth/Throat: No oropharyngeal exudate.  Eyes: EOM are normal. Pupils are equal, round, and reactive to light.  Neck: Neck supple.  Cardiovascular: Normal rate, regular rhythm and normal heart sounds.   Pulmonary/Chest: Effort normal and breath sounds normal.  Abdominal: Soft. Bowel sounds are normal. There is no tenderness. There is no rebound.  Musculoskeletal: She exhibits no edema.  Lymphadenopathy:    She has no cervical adenopathy.          Assessment & Plan:

## 2013-04-10 NOTE — Assessment & Plan Note (Addendum)
Doing well, will check HIV and CD4 today. She has had other labs done at work. She is offered/refuses condoms. She gets flu shot. See her back in 6 months.

## 2013-04-10 NOTE — Addendum Note (Signed)
Addended bySteva Colder on: 04/10/2013 10:17 AM   Modules accepted: Orders

## 2013-04-10 NOTE — Assessment & Plan Note (Signed)
Encouraged her to watch diet and exercise.  

## 2013-04-11 LAB — T-HELPER CELL (CD4) - (RCID CLINIC ONLY)
CD4 % Helper T Cell: 32 % — ABNORMAL LOW (ref 33–55)
CD4 T Cell Abs: 890 /uL (ref 400–2700)

## 2013-04-11 LAB — HIV-1 RNA QUANT-NO REFLEX-BLD: HIV-1 RNA Quant, Log: <1.3 {Log} (ref <1.30)

## 2013-05-07 ENCOUNTER — Other Ambulatory Visit (HOSPITAL_COMMUNITY): Payer: Self-pay | Admitting: Family Medicine

## 2013-05-07 DIAGNOSIS — Z1231 Encounter for screening mammogram for malignant neoplasm of breast: Secondary | ICD-10-CM

## 2013-05-18 ENCOUNTER — Other Ambulatory Visit: Payer: Self-pay | Admitting: Infectious Diseases

## 2013-05-23 ENCOUNTER — Telehealth: Payer: Self-pay | Admitting: *Deleted

## 2013-05-23 NOTE — Telephone Encounter (Signed)
PA approved for Atripla.  Reference number [id number] Approved 05/21/13 - 09/01/13. Pharmacy notified. Andree Coss, RN

## 2013-06-06 ENCOUNTER — Ambulatory Visit (HOSPITAL_COMMUNITY)
Admission: RE | Admit: 2013-06-06 | Discharge: 2013-06-06 | Disposition: A | Discharge status: Home / Self Care | Payer: PRIVATE HEALTH INSURANCE | Source: Ambulatory Visit | Attending: Family Medicine | Admitting: Family Medicine

## 2013-06-06 DIAGNOSIS — Z1231 Encounter for screening mammogram for malignant neoplasm of breast: Secondary | ICD-10-CM | POA: Insufficient documentation

## 2013-07-17 ENCOUNTER — Other Ambulatory Visit: Payer: Self-pay | Admitting: Infectious Diseases

## 2013-07-26 ENCOUNTER — Telehealth: Payer: Self-pay | Admitting: *Deleted

## 2013-07-26 NOTE — Telephone Encounter (Signed)
Pt needing MD, Lab and PAP smear appts.

## 2013-08-12 ENCOUNTER — Encounter: Payer: Self-pay | Admitting: Infectious Diseases

## 2013-08-12 ENCOUNTER — Ambulatory Visit (INDEPENDENT_AMBULATORY_CARE_PROVIDER_SITE_OTHER): Payer: PRIVATE HEALTH INSURANCE | Admitting: Infectious Diseases

## 2013-08-12 VITALS — BP 121/79 | HR 85 | Temp 98.8°F | Ht 68.0 in | Wt 181.0 lb

## 2013-08-12 DIAGNOSIS — Z79899 Other long term (current) drug therapy: Secondary | ICD-10-CM

## 2013-08-12 DIAGNOSIS — Z113 Encounter for screening for infections with a predominantly sexual mode of transmission: Secondary | ICD-10-CM

## 2013-08-12 DIAGNOSIS — B20 Human immunodeficiency virus [HIV] disease: Secondary | ICD-10-CM

## 2013-08-12 LAB — COMPREHENSIVE METABOLIC PANEL
ALT: 20 U/L (ref 0–35)
AST: 20 U/L (ref 0–37)
Albumin: 4.3 g/dL (ref 3.5–5.2)
Alkaline Phosphatase: 103 U/L (ref 39–117)
BUN: 12 mg/dL (ref 6–23)
CALCIUM: 9.5 mg/dL (ref 8.4–10.5)
CHLORIDE: 104 meq/L (ref 96–112)
CO2: 28 meq/L (ref 19–32)
CREATININE: 0.95 mg/dL (ref 0.50–1.10)
Glucose, Bld: 88 mg/dL (ref 70–99)
Potassium: 3.9 mEq/L (ref 3.5–5.3)
Sodium: 140 mEq/L (ref 135–145)
Total Bilirubin: 0.4 mg/dL (ref 0.3–1.2)
Total Protein: 7.1 g/dL (ref 6.0–8.3)

## 2013-08-12 LAB — LIPID PANEL
CHOL/HDL RATIO: 3.4 ratio
CHOLESTEROL: 233 mg/dL — AB (ref 0–200)
HDL: 68 mg/dL (ref >39)
LDL Cholesterol: 150 mg/dL — ABNORMAL HIGH (ref 0–99)
Triglycerides: 76 mg/dL (ref <150)
VLDL: 15 mg/dL (ref 0–40)

## 2013-08-12 LAB — CBC
HEMATOCRIT: 40.4 % (ref 36.0–46.0)
Hemoglobin: 14 g/dL (ref 12.0–15.0)
MCH: 29.4 pg (ref 26.0–34.0)
MCHC: 34.7 g/dL (ref 30.0–36.0)
MCV: 84.7 fL (ref 78.0–100.0)
Platelets: 301 10*3/uL (ref 150–400)
RBC: 4.77 MIL/uL (ref 3.87–5.11)
RDW: 14.3 % (ref 11.5–15.5)
WBC: 4.6 10*3/uL (ref 4.0–10.5)

## 2013-08-12 NOTE — Assessment & Plan Note (Signed)
She is doing very well. Appreciate PCP f/u. Has gotten flu, and gotten her PNVX x 3. Offered/refused condoms. Will see her back in 6 months.

## 2013-08-12 NOTE — Progress Notes (Signed)
   Subjective:    Patient ID: Brianna Weeks, female    DOB: 1965-12-05, 48 y.o.   MRN: [id number]  HPI 48 yo [place] F with HIV+ and Graves disease. Recieved I-131 (07-21-08).  Atrpla/DTGV. Is doing well, feels happy. Had normal mammo in 2014, last pap was 2013 (normal, getting QOY now).   HIV 1 RNA Quant (copies/mL)  Date Value  04/10/2013 <20   08/31/2012 <20   04/27/2011 <20      CD4 T Cell Abs (/uL)  Date Value  04/10/2013 890   08/31/2012 870   04/27/2011 950    Review of Systems  Constitutional: Negative for appetite change and unexpected weight change.  Gastrointestinal: Negative for diarrhea and constipation.  Genitourinary: Negative for difficulty urinating.       Amenorrheic       Objective:   Physical Exam  Constitutional: She appears well-developed and well-nourished.  HENT:  Mouth/Throat: No oropharyngeal exudate.  Eyes: EOM are normal. Pupils are equal, round, and reactive to light.  Neck: Neck supple.  Cardiovascular: Normal rate, regular rhythm and normal heart sounds.   Pulmonary/Chest: Effort normal and breath sounds normal.  Abdominal: Soft. Bowel sounds are normal. She exhibits no distension. There is no tenderness.  Lymphadenopathy:    She has no cervical adenopathy.          Assessment & Plan:

## 2013-08-13 LAB — HIV-1 RNA QUANT-NO REFLEX-BLD
HIV 1 RNA Quant: <20 copies/mL (ref <20)
HIV-1 RNA Quant, Log: <1.3 {Log} (ref <1.30)

## 2013-08-13 LAB — RPR

## 2013-08-13 LAB — T-HELPER CELL (CD4) - (RCID CLINIC ONLY)
CD4 T CELL HELPER: 31 % — AB (ref 33–55)
CD4 T Cell Abs: 870 /uL (ref 400–2700)

## 2013-09-15 ENCOUNTER — Other Ambulatory Visit: Payer: Self-pay | Admitting: Infectious Diseases

## 2013-11-19 ENCOUNTER — Other Ambulatory Visit: Payer: Self-pay | Admitting: Infectious Diseases

## 2013-11-19 DIAGNOSIS — B20 Human immunodeficiency virus [HIV] disease: Secondary | ICD-10-CM

## 2014-03-24 ENCOUNTER — Ambulatory Visit (INDEPENDENT_AMBULATORY_CARE_PROVIDER_SITE_OTHER): Payer: PRIVATE HEALTH INSURANCE | Admitting: Infectious Diseases

## 2014-03-24 ENCOUNTER — Encounter: Payer: Self-pay | Admitting: Infectious Diseases

## 2014-03-24 VITALS — BP 131/88 | HR 80 | Temp 98.7°F | Ht 68.5 in | Wt 181.0 lb

## 2014-03-24 DIAGNOSIS — R7309 Other abnormal glucose: Secondary | ICD-10-CM

## 2014-03-24 DIAGNOSIS — Z23 Encounter for immunization: Secondary | ICD-10-CM

## 2014-03-24 DIAGNOSIS — Z113 Encounter for screening for infections with a predominantly sexual mode of transmission: Secondary | ICD-10-CM

## 2014-03-24 DIAGNOSIS — E890 Postprocedural hypothyroidism: Secondary | ICD-10-CM

## 2014-03-24 DIAGNOSIS — B20 Human immunodeficiency virus [HIV] disease: Secondary | ICD-10-CM

## 2014-03-24 LAB — RPR

## 2014-03-24 NOTE — Assessment & Plan Note (Signed)
Has been doing well, her last TSH was normal.

## 2014-03-24 NOTE — Progress Notes (Signed)
   Subjective:    Patient ID: Brianna Weeks, female    DOB: Apr 26, 1966, 48 y.o.   MRN: [id number]  HPI 49 yo [place] F with HIV+ and Graves disease. Recieved I-131 (07-21-08).  Atrpla/DTGV.  Had normal mammo in 2014, last pap was 2013 (normal, getting QOY now). Had PAP 2 weeks ago, normal.  Has been busy, tired. No problems with medicines. Had labs done at work, A1C 6.2%. Has been watching diet, exercising more.     Review of Systems  See HPI     Objective:   Physical Exam  Constitutional: She appears well-developed and well-nourished.  HENT:  Mouth/Throat: No oropharyngeal exudate.  Eyes: EOM are normal. Pupils are equal, round, and reactive to light.  Neck: Neck supple.  Cardiovascular: Normal rate, regular rhythm and normal heart sounds.   Pulmonary/Chest: Effort normal and breath sounds normal.  Abdominal: Soft. Bowel sounds are normal.  Musculoskeletal: She exhibits no edema.  Lymphadenopathy:    She has no cervical adenopathy.          Assessment & Plan:

## 2014-03-24 NOTE — Assessment & Plan Note (Signed)
This appears to be improved. She is working on watching her diet and exercise. Some effect from her thyroid disease.

## 2014-03-25 LAB — HIV-1 RNA QUANT-NO REFLEX-BLD: HIV-1 RNA Quant, Log: <1.3 {Log} (ref <1.30)

## 2014-03-25 LAB — T-HELPER CELL (CD4) - (RCID CLINIC ONLY)
CD4 % Helper T Cell: 32 % — ABNORMAL LOW (ref 33–55)
CD4 T CELL ABS: 840 /uL (ref 400–2700)

## 2014-04-03 ENCOUNTER — Encounter: Payer: Self-pay | Admitting: *Deleted

## 2014-04-28 ENCOUNTER — Encounter: Payer: Self-pay | Admitting: *Deleted

## 2014-04-30 ENCOUNTER — Other Ambulatory Visit: Payer: Self-pay | Admitting: Infectious Diseases

## 2014-04-30 DIAGNOSIS — E669 Obesity, unspecified: Secondary | ICD-10-CM

## 2014-04-30 MED ORDER — LORCASERIN HCL 10 MG PO TABS: 10.0000 mg | ORAL_TABLET | Freq: Every day | ORAL | Status: DC

## 2014-06-03 ENCOUNTER — Other Ambulatory Visit (HOSPITAL_COMMUNITY): Payer: Self-pay | Admitting: Obstetrics and Gynecology

## 2014-06-03 DIAGNOSIS — Z1231 Encounter for screening mammogram for malignant neoplasm of breast: Secondary | ICD-10-CM

## 2014-06-10 ENCOUNTER — Ambulatory Visit (HOSPITAL_COMMUNITY): Payer: PRIVATE HEALTH INSURANCE | Attending: Obstetrics and Gynecology

## 2014-06-17 ENCOUNTER — Ambulatory Visit (HOSPITAL_COMMUNITY)
Admission: RE | Admit: 2014-06-17 | Discharge: 2014-06-17 | Disposition: A | Discharge status: Home / Self Care | Payer: Commercial Managed Care - PPO | Source: Ambulatory Visit | Attending: Obstetrics and Gynecology | Admitting: Obstetrics and Gynecology

## 2014-06-17 DIAGNOSIS — Z1231 Encounter for screening mammogram for malignant neoplasm of breast: Secondary | ICD-10-CM | POA: Insufficient documentation

## 2014-08-18 ENCOUNTER — Ambulatory Visit: Payer: PRIVATE HEALTH INSURANCE | Admitting: Infectious Diseases

## 2014-08-20 ENCOUNTER — Telehealth: Payer: Self-pay

## 2014-08-20 MED ORDER — EFAVIRENZ-EMTRICITAB-TENOFOVIR 600-200-300 MG PO TABS: 1.0000 | ORAL_TABLET | Freq: Every day | ORAL | Status: DC

## 2014-08-20 NOTE — Telephone Encounter (Signed)
Patient is calling c/o not able to get Atripla filled . She switched insurance companies and a prior approval is needed.  She has been on the phone for two days trying to get medication and without medications for 3 days.   I called [employer] the patient's insurance company and requested prior authorization for Atripla.  Authorization  Given :  [id number]  Dates given:  08-20-14 thru 08-20-14.    Awaitng copy of authorization. Medication sent to pharmacy.   Laurell Josephsammy K Westlynn Fifer, RN

## 2014-08-26 ENCOUNTER — Other Ambulatory Visit: Payer: Self-pay | Admitting: Infectious Diseases

## 2014-09-22 ENCOUNTER — Ambulatory Visit: Payer: PRIVATE HEALTH INSURANCE | Admitting: Infectious Diseases

## 2014-09-24 ENCOUNTER — Encounter: Payer: Self-pay | Admitting: Infectious Diseases

## 2014-09-24 ENCOUNTER — Ambulatory Visit (INDEPENDENT_AMBULATORY_CARE_PROVIDER_SITE_OTHER): Payer: Commercial Managed Care - PPO | Admitting: Infectious Diseases

## 2014-09-24 VITALS — BP 122/87 | HR 74 | Temp 97.9°F | Ht 69.0 in | Wt 178.0 lb

## 2014-09-24 DIAGNOSIS — E785 Hyperlipidemia, unspecified: Secondary | ICD-10-CM | POA: Diagnosis not present

## 2014-09-24 DIAGNOSIS — Z79899 Other long term (current) drug therapy: Secondary | ICD-10-CM | POA: Diagnosis not present

## 2014-09-24 DIAGNOSIS — Z113 Encounter for screening for infections with a predominantly sexual mode of transmission: Secondary | ICD-10-CM | POA: Diagnosis not present

## 2014-09-24 DIAGNOSIS — B20 Human immunodeficiency virus [HIV] disease: Secondary | ICD-10-CM | POA: Diagnosis not present

## 2014-09-24 LAB — RPR

## 2014-09-24 NOTE — Assessment & Plan Note (Signed)
She is doing very well.  Will speak with her about changing her ART to new complera with TAF, DTGV.  Offered/refused condoms.  Will check her VL and CD4 and rpr today.

## 2014-09-24 NOTE — Progress Notes (Signed)
   Subjective:    Patient ID: Brianna Weeks, female    DOB: 23-Jun-1966, 49 y.o.   MRN: [id number]  HPI 49 yo [place] F with HIV+ and Graves disease. Recieved I-131 (07-21-08).  Atrpla/DTGV.  Had normal mammo in 2014, last pap/mammo was 2015  (normal, getting QOY now).    Feels great. No complaints. Taking atripla and DTGV.  Needs synthroid added to her med list.   HIV 1 RNA QUANT (copies/mL)  Date Value  03/24/2014 <20  08/12/2013 <20  04/10/2013 <20   CD4 T CELL ABS (/uL)  Date Value  03/24/2014 840  08/12/2013 870  04/10/2013 890    Review of Systems  Constitutional: Negative for appetite change and unexpected weight change.  Gastrointestinal: Negative for diarrhea and constipation.  Genitourinary: Negative for difficulty urinating and menstrual problem.      Objective:   Physical Exam  Constitutional: She appears well-developed and well-nourished.  HENT:  Mouth/Throat: No oropharyngeal exudate.  Eyes: EOM are normal. Pupils are equal, round, and reactive to light.  Neck: Neck supple.  Cardiovascular: Normal rate, regular rhythm and normal heart sounds.   Pulmonary/Chest: Effort normal and breath sounds normal.  Abdominal: Soft. Bowel sounds are normal. She exhibits no distension. There is no tenderness.  Musculoskeletal: She exhibits no edema.  Lymphadenopathy:    She has no cervical adenopathy.          Assessment & Plan:

## 2014-09-24 NOTE — Addendum Note (Signed)
Addended by: Mariea ClontsGREEN, Claudette Wermuth D on: 09/24/2014 10:59 AM   Modules accepted: Orders

## 2014-09-24 NOTE — Assessment & Plan Note (Signed)
Working on diet and exercise.  Related to hiv and thyroid dysfunction.

## 2014-09-24 NOTE — Assessment & Plan Note (Signed)
Will continue to f/u with dr Everardo Allellison.

## 2014-09-25 LAB — HIV-1 RNA QUANT-NO REFLEX-BLD: HIV 1 RNA Quant: <20 copies/mL (ref <20)

## 2014-09-25 LAB — T-HELPER CELL (CD4) - (RCID CLINIC ONLY)
CD4 % Helper T Cell: 32 % — ABNORMAL LOW (ref 33–55)
CD4 T Cell Abs: 1640 /uL (ref 400–2700)

## 2015-02-23 ENCOUNTER — Telehealth: Payer: Self-pay | Admitting: Family Medicine

## 2015-02-23 NOTE — Telephone Encounter (Signed)
Son is requesting Dr. Katrinka Blazing to work mother in for a swollen knee.  Not sure if right or left.

## 2015-02-23 NOTE — Telephone Encounter (Signed)
lmovm for pt to return call.  

## 2015-02-23 NOTE — Telephone Encounter (Signed)
Pt scheduled for Thursday @ 7:45a.

## 2015-02-26 ENCOUNTER — Ambulatory Visit (INDEPENDENT_AMBULATORY_CARE_PROVIDER_SITE_OTHER): Payer: Commercial Managed Care - PPO | Admitting: Family Medicine

## 2015-02-26 ENCOUNTER — Encounter: Payer: Self-pay | Admitting: Family Medicine

## 2015-02-26 ENCOUNTER — Other Ambulatory Visit (INDEPENDENT_AMBULATORY_CARE_PROVIDER_SITE_OTHER): Payer: Commercial Managed Care - PPO

## 2015-02-26 VITALS — BP 128/82 | HR 78 | Ht 68.0 in | Wt 176.0 lb

## 2015-02-26 DIAGNOSIS — M25562 Pain in left knee: Secondary | ICD-10-CM | POA: Diagnosis not present

## 2015-02-26 DIAGNOSIS — S76102A Unspecified injury of left quadriceps muscle, fascia and tendon, initial encounter: Secondary | ICD-10-CM | POA: Diagnosis not present

## 2015-02-26 NOTE — Patient Instructions (Signed)
Good to see you You have a good kid I think you have quad tendon issue and muscle inflammation pennsaid pinkie amount topically 2 times daily as needed.  Ice 20 minutes 2 times daily. Usually after activity and before bed. Exercises 3 times a week.  Ok to lift but 50% of weight and increase 10-15% a week.  Avoid jumping and running until I see you again See me again in 3 weeks.

## 2015-02-26 NOTE — Assessment & Plan Note (Signed)
Patient does have an injury to the quadricep. There is a pus potential for the patient having more of a myositis. This could be secondary to patient's comorbidities. We will monitor. With the initial event be more of an injury though we will treat it as such. Patient has been treated for the distal iliotibial band but not quadricep tendon itself. Patient will increase her activity slowly and we'll have her decreasing the weight and avoid any type of high intensity exercises at this time. We discussed the possibility of compression. Patient and will come back and see me again in 3-4 weeks to make sure that she is healing appropriately.

## 2015-02-26 NOTE — Progress Notes (Signed)
[name] [address] [place], [place] [address] Phone: [phone] Subjective:    I'm seeing this patient by the request  of:  Weeks,Brianna S, MD   CC: Left knee pain   BJY:NWGNFAOZHY Brianna Weeks is a 49 y.o. female coming in with complaint of left knee pain. Patient has had this pain since May 12 of this year. Patient states that she was running across a field and did not notice any pain then but today afterward she started having discomfort. Seems to be more on the lateral aspect of the knee. States that going up and down stairs and certain activities be quite painful. Patient continues to be able to workout and do daily activities. Patient has seen another provider and has been given exercises that have not been beneficial. Patient states if anything it seems to be getting worse. Denies any fevers or chills or any abnormal weight loss. Denies any swelling that she can associate at this time. Does respond well to meloxicam. Denies any radiation down the legs or any numbness or weakness. Patient did bring CD today with outside x-rays. Patient does have mild patellofemoral osteophytic changes but no gross bony deformity.  Past Medical History  Diagnosis Date  . Thyroid disease   . HIV infection   . Anemia    No past surgical history on file. History  Substance Use Topics  . Smoking status: Never Smoker   . Smokeless tobacco: Never Used  . Alcohol Use: No   Allergies  Allergen Reactions  . Ciprofloxacin Nausea And Vomiting   Family History  Problem Relation Age of Onset  . Hypertension Mother         Past medical history, social, surgical and family history all reviewed in electronic medical record.   Review of Systems: No headache, visual changes, nausea, vomiting, diarrhea, constipation, dizziness, abdominal pain, skin rash, fevers, chills, night sweats, weight loss, swollen lymph nodes, body aches, joint swelling, muscle aches, chest  pain, shortness of breath, mood changes.   Objective Blood pressure 128/82, pulse 78, height 5\' 8"  (1.727 m), weight 176 lb (79.833 kg), SpO2 99 %.  General: No apparent distress alert and oriented x3 mood and affect normal, dressed appropriately.  HEENT: Pupils equal, extraocular movements intact  Respiratory: Patient's speak in full sentences and does not appear short of breath  Cardiovascular: No lower extremity edema, non tender, no erythema  Skin: Warm dry intact with no signs of infection or rash on extremities or on axial skeleton.  Abdomen: Soft nontender  Neuro: Cranial nerves II through XII are intact, neurovascularly intact in all extremities with 2+ DTRs and 2+ pulses.  Lymph: No lymphadenopathy of posterior or anterior cervical chain or axillae bilaterally.  Gait normal with good balance and coordination.  MSK:  Non tender with full range of motion and good stability and symmetric strength and tone of shoulders, elbows, wrist, hip, and ankles bilaterally.  Knee: Left Patient does have some mild abnormality of the vastus lateralis muscle compared to the contralateral side Tender to palpation over the lateral quadriceps as well as somewhat over the distal iliotibial. ROM full in flexion and extension and lower leg rotation. Ligaments with solid consistent endpoints including ACL, PCL, LCL, MCL. Negative Mcmurray's, Apley's, and Thessalonian tests. Mild painful patellar compression Patellar glide with mild crepitus. Lateral tracking noted Patellar and quadriceps tendons unremarkable. Hamstring and quadriceps strength is normal.  Contralateral knee really unremarkable.  MSK US performed of: Left knee  This study was ordered, performed, and interpreted by Terrilee Files D.O.  Knee: All structures visualized. Anteromedial, anterolateral, posteromedial, and posterolateral menisci unremarkable without tearing, fraying, effusion, or displacement. Patellar Tendon unremarkable on long  and transverse views without effusion. No abnormality of prepatellar bursa. LCL and MCL unremarkable on long and transverse views. No abnormality of origin of medial or lateral head of the gastrocnemius. Patient's muscle though at the vastus lateralis seems to have may be a injury that chronic at the vastus lateralis muscle tendinous juncture. Mild hypoechoic changes in this area. Mild bursitis noted underneath the distal iliotibial band. Significant hypoechoic changes of the muscle itself.  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  This included exercises focusing on stretching, strengthening, with significant focus on eccentric aspects.  Patient is given vastus medialis oblique as well as lateral stretching exercises. Patient was given core strengthening and hip abductor strengthening to all load the lateral aspect of the knee. Proper technique shown and discussed handout in great detail with ATC.  All questions were discussed and answered.      Impression and Recommendations:     This case required medical decision making of moderate complexity.

## 2015-02-26 NOTE — Progress Notes (Signed)
Pre visit review using our clinic review tool, if applicable. No additional management support is needed unless otherwise documented below in the visit note. 

## 2015-03-10 ENCOUNTER — Other Ambulatory Visit: Payer: Self-pay | Admitting: Infectious Diseases

## 2015-03-10 DIAGNOSIS — B20 Human immunodeficiency virus [HIV] disease: Secondary | ICD-10-CM

## 2015-03-19 ENCOUNTER — Encounter: Payer: Self-pay | Admitting: Family Medicine

## 2015-03-19 ENCOUNTER — Ambulatory Visit (INDEPENDENT_AMBULATORY_CARE_PROVIDER_SITE_OTHER): Payer: Commercial Managed Care - PPO | Admitting: Family Medicine

## 2015-03-19 VITALS — BP 136/78 | HR 80 | Ht 68.0 in | Wt 176.0 lb

## 2015-03-19 DIAGNOSIS — S76102A Unspecified injury of left quadriceps muscle, fascia and tendon, initial encounter: Secondary | ICD-10-CM | POA: Diagnosis not present

## 2015-03-19 NOTE — Patient Instructions (Signed)
Good to see you Thank you for making me look good ;) Ice is your friend  Try compression with exercise for the next 3 weeks Any pain just take a step back See me again in 6 weeks if not perfect

## 2015-03-19 NOTE — Progress Notes (Signed)
Pre visit review using our clinic review tool, if applicable. No additional management support is needed unless otherwise documented below in the visit note. 

## 2015-03-19 NOTE — Progress Notes (Signed)
  [name] [address] [place], [place] [address] Phone: [phone] Subjective:     CC: Left knee pain follow-up  BJY:NWGNFAOZHY Brianna Weeks is a 49 y.o. female coming in with complaint of left knee pain. Patient was found to have some mild patellofemoral arthritis in addition of this left quadricep injury that could've been secondary to a myositis. Patient was to do compression, icing, and topical anti-inflammatory syndrome home exercises. Patient's had oral anti-inflammatory for pain as well. Patient states she is 95% better. States that it is only given her some mild discomfort when she is on a treadmill. Patient states otherwise she is minimal to do all activities as well as we'll exercises and is not having any pain. Patient is very happy with the results.  Past Medical History  Diagnosis Date  . Thyroid disease   . HIV infection   . Anemia    No past surgical history on file. Social History  Substance Use Topics  . Smoking status: Never Smoker   . Smokeless tobacco: Never Used  . Alcohol Use: No   Allergies  Allergen Reactions  . Ciprofloxacin Nausea And Vomiting   Family History  Problem Relation Age of Onset  . Hypertension Mother         Past medical history, social, surgical and family history all reviewed in electronic medical record.   Review of Systems: No headache, visual changes, nausea, vomiting, diarrhea, constipation, dizziness, abdominal pain, skin rash, fevers, chills, night sweats, weight loss, swollen lymph nodes, body aches, joint swelling, muscle aches, chest pain, shortness of breath, mood changes.   Objective Blood pressure 136/78, pulse 80, height  (1.727 m), weight 176 lb (79.833 kg).  General: No apparent distress alert and oriented x3 mood and affect normal, dressed appropriately.  HEENT: Pupils equal, extraocular movements intact  Respiratory: Patient's speak in full sentences and does not appear  short of breath  Cardiovascular: No lower extremity edema, non tender, no erythema  Skin: Warm dry intact with no signs of infection or rash on extremities or on axial skeleton.  Abdomen: Soft nontender  Neuro: Cranial nerves II through XII are intact, neurovascularly intact in all extremities with 2+ DTRs and 2+ pulses.  Lymph: No lymphadenopathy of posterior or anterior cervical chain or axillae bilaterally.  Gait normal with good balance and coordination.  MSK:  Non tender with full range of motion and good stability and symmetric strength and tone of shoulders, elbows, wrist, hip, and ankles bilaterally.  Knee: Left No significant abnormality noted at the vastus lateralis or swelling Nontender ROM full in flexion and extension and lower leg rotation. Ligaments with solid consistent endpoints including ACL, PCL, LCL, MCL. Negative Mcmurray's, Apley's, and Thessalonian tests. Mild painful patellar compression Patellar glide with mild crepitus. Lateral tracking noted but decrease from previous exam Patellar and quadriceps tendons unremarkable. Hamstring and quadriceps strength is normal.  Contralateral knee really unremarkable.     Impression and Recommendations:     This case required medical decision making of moderate complexity.

## 2015-03-19 NOTE — Assessment & Plan Note (Signed)
Patient is doing significantly better at this time. Patient can have the topical anti-inflammatory's as needed. We discussed that she will call us and we will send a prescription. We also discussed with compression sleeve if needed. If patient has any worsening symptoms she'll come back otherwise follow-up with me as needed.

## 2015-04-08 ENCOUNTER — Other Ambulatory Visit: Payer: Self-pay | Admitting: Obstetrics and Gynecology

## 2015-04-09 LAB — CYTOLOGY - PAP

## 2015-05-06 ENCOUNTER — Encounter: Payer: Self-pay | Admitting: Infectious Diseases

## 2015-05-06 ENCOUNTER — Ambulatory Visit (INDEPENDENT_AMBULATORY_CARE_PROVIDER_SITE_OTHER): Payer: Commercial Managed Care - PPO | Admitting: Infectious Diseases

## 2015-05-06 VITALS — BP 150/83 | HR 76 | Temp 97.6°F | Wt 170.0 lb

## 2015-05-06 DIAGNOSIS — E785 Hyperlipidemia, unspecified: Secondary | ICD-10-CM | POA: Diagnosis not present

## 2015-05-06 DIAGNOSIS — H11002 Unspecified pterygium of left eye: Secondary | ICD-10-CM | POA: Diagnosis not present

## 2015-05-06 DIAGNOSIS — B20 Human immunodeficiency virus [HIV] disease: Secondary | ICD-10-CM | POA: Diagnosis not present

## 2015-05-06 MED ORDER — EMTRICITAB-RILPIVIR-TENOFOV AF 200-25-25 MG PO TABS: 1.0000 | ORAL_TABLET | Freq: Every day | ORAL | Status: DC

## 2015-05-06 NOTE — Progress Notes (Signed)
   Subjective:    Patient ID: Brianna MoellerMary I Weeks, female    DOB: 1965-12-25, 49 y.o.   MRN: [id number]  HPI 49 yo [place] F with HIV+ and Graves disease. Recieved I-131 (07-21-08).  Atrpla/DTGV.  Had nl pap 03-2015. Nl mammo 05-2014.  Has been doing well.  No problems with her meds. Has had no side effects from EFV.     HIV 1 RNA QUANT (copies/mL)  Date Value  09/24/2014 <20  03/24/2014 <20  08/12/2013 <20   CD4 T CELL ABS (/uL)  Date Value  09/24/2014 1640  03/24/2014 840  08/12/2013 870   ammenorheic Review of Systems  Constitutional: Negative for appetite change and unexpected weight change.  Eyes: Negative for visual disturbance.  Gastrointestinal: Negative for diarrhea and constipation.  Genitourinary: Negative for difficulty urinating and menstrual problem.       Objective:   Physical Exam  Constitutional: She appears well-developed and well-nourished.  HENT:  Mouth/Throat: No oropharyngeal exudate.  Eyes: EOM are normal. Pupils are equal, round, and reactive to light.  L eye pterygium  Neck: Neck supple.  Cardiovascular: Normal rate, regular rhythm and normal heart sounds.   Pulmonary/Chest: Effort normal and breath sounds normal.  Abdominal: Soft. Bowel sounds are normal. There is no tenderness. There is no rebound.  Musculoskeletal: She exhibits no edema.  Lymphadenopathy:    She has no cervical adenopathy.       Assessment & Plan:

## 2015-05-06 NOTE — Assessment & Plan Note (Signed)
She will f/u with ophtho  Counseled her that vision loss with this surgery is extremely unlikely as it involves anterior eye (not posterior-optic nerve, rods/cones).

## 2015-05-06 NOTE — Assessment & Plan Note (Signed)
Last Chol at work was 226. She is running 5 miles qod.

## 2015-05-06 NOTE — Assessment & Plan Note (Signed)
Will change her meds to odefsy/dtgv.  Take with food.  Refused flu.  Offered/refused condoms.  rtc in 3 months lab, 4 months visit.

## 2015-05-07 LAB — T-HELPER CELL (CD4) - (RCID CLINIC ONLY)
CD4 T CELL ABS: 720 /uL (ref 400–2700)
CD4 T CELL HELPER: 32 % — AB (ref 33–55)

## 2015-05-08 ENCOUNTER — Telehealth: Payer: Self-pay | Admitting: *Deleted

## 2015-05-08 DIAGNOSIS — B20 Human immunodeficiency virus [HIV] disease: Secondary | ICD-10-CM

## 2015-05-08 LAB — HIV-1 RNA QUANT-NO REFLEX-BLD
HIV 1 RNA Quant: <20 copies/mL (ref <20)
HIV-1 RNA Quant, Log: <1.3 {Log} (ref <1.30)

## 2015-05-08 NOTE — Telephone Encounter (Signed)
Continue truvada til we can have her seen by Clifton CustardAaron for med assistance

## 2015-05-08 NOTE — Telephone Encounter (Signed)
[employer] insurance not [place].  MD please advise about alternative.

## 2015-05-20 ENCOUNTER — Telehealth: Payer: Self-pay | Admitting: *Deleted

## 2015-05-20 MED ORDER — EMTRICITABINE-TENOFOVIR DF 200-300 MG PO TABS: 1.0000 | ORAL_TABLET | Freq: Every day | ORAL | Status: DC

## 2015-05-20 NOTE — Telephone Encounter (Signed)
[facility]

## 2015-05-20 NOTE — Telephone Encounter (Signed)
Thank you for your dedication to the care of our patients, to our community and your commitment to excellence.   

## 2015-05-20 NOTE — Telephone Encounter (Signed)
Truvada ready to pick up at pharmacy.

## 2015-05-20 NOTE — Addendum Note (Signed)
Addended by: Jennet MaduroESTRIDGE, DENISE D on: 05/20/2015 11:20 AM   Modules accepted: Orders, Medications

## 2015-05-25 ENCOUNTER — Other Ambulatory Visit: Payer: Self-pay | Admitting: *Deleted

## 2015-05-25 NOTE — Telephone Encounter (Signed)
Odefsey approved through 05/19/16.  Notified pharmacy, confirmed new regimen of Odefsey and Tivicay per Dr. Moshe CiproHatcher's office note. Please confirm patient's regimen. Andree CossHowell, Aqueelah Cotrell M, RN

## 2015-05-26 NOTE — Telephone Encounter (Signed)
thanks

## 2015-06-11 ENCOUNTER — Encounter: Payer: Self-pay | Admitting: Infectious Diseases

## 2015-07-12 ENCOUNTER — Other Ambulatory Visit: Payer: Self-pay | Admitting: Infectious Diseases

## 2015-08-11 ENCOUNTER — Other Ambulatory Visit: Payer: Self-pay | Admitting: *Deleted

## 2015-08-12 ENCOUNTER — Telehealth: Payer: Self-pay | Admitting: *Deleted

## 2015-08-12 DIAGNOSIS — B20 Human immunodeficiency virus [HIV] disease: Secondary | ICD-10-CM

## 2015-08-12 NOTE — Telephone Encounter (Signed)
Prior Authorization completed for [facility] and faxed to [employer]. Confirmation received and waiting on notification of approval or denial. [name]

## 2015-08-18 MED ORDER — EMTRICITAB-RILPIVIR-TENOFOV AF 200-25-25 MG PO TABS: 1.0000 | ORAL_TABLET | Freq: Every day | ORAL | Status: DC

## 2015-08-18 NOTE — Telephone Encounter (Signed)
PA approved through 04/2016 @ [facility].

## 2015-08-18 NOTE — Addendum Note (Signed)
Addended by: Jennet Maduro D on: 08/18/2015 04:54 PM   Modules accepted: Orders

## 2015-08-24 ENCOUNTER — Other Ambulatory Visit: Payer: Self-pay

## 2015-08-24 DIAGNOSIS — Z1231 Encounter for screening mammogram for malignant neoplasm of breast: Secondary | ICD-10-CM

## 2015-09-03 ENCOUNTER — Ambulatory Visit: Payer: Commercial Managed Care - PPO

## 2015-09-08 ENCOUNTER — Ambulatory Visit: Payer: Commercial Managed Care - PPO | Admitting: Infectious Diseases

## 2015-09-09 ENCOUNTER — Ambulatory Visit: Payer: Commercial Managed Care - PPO | Admitting: Infectious Diseases

## 2015-09-10 ENCOUNTER — Other Ambulatory Visit: Payer: Self-pay | Admitting: *Deleted

## 2015-09-10 ENCOUNTER — Telehealth: Payer: Self-pay | Admitting: *Deleted

## 2015-09-10 DIAGNOSIS — B20 Human immunodeficiency virus [HIV] disease: Secondary | ICD-10-CM

## 2015-09-10 MED ORDER — EMTRICITAB-RILPIVIR-TENOFOV AF 200-25-25 MG PO TABS: 1.0000 | ORAL_TABLET | Freq: Every day | ORAL | Status: DC

## 2015-09-10 MED ORDER — DOLUTEGRAVIR SODIUM 50 MG PO TABS: 50.0000 mg | ORAL_TABLET | Freq: Every day | ORAL | Status: DC

## 2015-09-10 NOTE — Telephone Encounter (Signed)
PA needed for Tivicay and Charlett Lango, high dollar price.  Completed and sent to [employer].  May take up to 72 business hours for response.

## 2015-09-15 NOTE — Telephone Encounter (Addendum)
PA approved for Tivicay through 09/09/16.  Pharmacy notified.

## 2015-10-28 ENCOUNTER — Encounter: Payer: Self-pay | Admitting: Infectious Diseases

## 2015-10-28 ENCOUNTER — Ambulatory Visit (INDEPENDENT_AMBULATORY_CARE_PROVIDER_SITE_OTHER): Payer: Commercial Managed Care - PPO | Admitting: Infectious Diseases

## 2015-10-28 VITALS — BP 134/81 | HR 71 | Temp 97.6°F | Wt 174.0 lb

## 2015-10-28 DIAGNOSIS — Z79899 Other long term (current) drug therapy: Secondary | ICD-10-CM | POA: Diagnosis not present

## 2015-10-28 DIAGNOSIS — B20 Human immunodeficiency virus [HIV] disease: Secondary | ICD-10-CM

## 2015-10-28 DIAGNOSIS — E890 Postprocedural hypothyroidism: Secondary | ICD-10-CM

## 2015-10-28 DIAGNOSIS — H11002 Unspecified pterygium of left eye: Secondary | ICD-10-CM

## 2015-10-28 DIAGNOSIS — Z113 Encounter for screening for infections with a predominantly sexual mode of transmission: Secondary | ICD-10-CM

## 2015-10-28 NOTE — Assessment & Plan Note (Signed)
She has TSH of 5.010 on 10-12-15 She has had f/u with endo

## 2015-10-28 NOTE — Assessment & Plan Note (Signed)
She is doing very well Given condoms Refuses flu shot.  Explained her meds to her Will check CD4 and VL  And RPRtoday.  Her other labs were done at work Will email her results when she emails me Will see her back in 6 months

## 2015-10-28 NOTE — Assessment & Plan Note (Signed)
She has had surgery though there is still some remnant of this. Also has in R eye.  No vision change.

## 2015-10-28 NOTE — Progress Notes (Signed)
   Subjective:    Patient ID: Brianna Weeks, female    DOB: Sep 23, 1965, 50 y.o.   MRN: [id number]  HPI 50 yo [place] F with HIV+ and Graves disease. Recieved I-131 (07-21-08).  Atrpla/DTGV --> odefsey/dtgv.  Had nl pap 03-2015. NL mammo [id number].   HIV 1 RNA QUANT (copies/mL)  Date Value  05/06/2015 <20  09/24/2014 <20  03/24/2014 <20   CD4 T CELL ABS (/uL)  Date Value  05/06/2015 720  09/24/2014 1640  03/24/2014 840    Has been feeling. Has gained a little weight. Has been running/walking. Wants to lose 6#.  Missed her mammo last year, will get when she has her physical exam.   Review of Systems  Constitutional: Negative for appetite change and unexpected weight change.  Gastrointestinal: Negative for diarrhea and constipation.  Genitourinary: Negative for difficulty urinating.       Objective:   Physical Exam  Constitutional: She appears well-developed and well-nourished.  HENT:  Mouth/Throat: No oropharyngeal exudate.  Eyes: EOM are normal. Pupils are equal, round, and reactive to light.  Neck: Neck supple.  Cardiovascular: Normal rate, regular rhythm and normal heart sounds.   Pulmonary/Chest: Effort normal and breath sounds normal.  Abdominal: Soft. Bowel sounds are normal. There is no tenderness. There is no rebound.  Musculoskeletal: She exhibits no edema.  Lymphadenopathy:    She has no cervical adenopathy.      Assessment & Plan:

## 2015-10-29 LAB — HIV-1 RNA QUANT-NO REFLEX-BLD
HIV 1 RNA Quant: <20 copies/mL (ref <20)
HIV-1 RNA Quant, Log: <1.3 Log copies/mL (ref <1.30)

## 2015-10-29 LAB — T-HELPER CELL (CD4) - (RCID CLINIC ONLY)
CD4 % Helper T Cell: 36 % (ref 33–55)
CD4 T CELL ABS: 910 /uL (ref 400–2700)

## 2015-10-29 LAB — RPR

## 2016-03-07 ENCOUNTER — Other Ambulatory Visit: Payer: Self-pay | Admitting: Infectious Diseases

## 2016-03-07 DIAGNOSIS — B20 Human immunodeficiency virus [HIV] disease: Secondary | ICD-10-CM

## 2016-05-04 ENCOUNTER — Ambulatory Visit (INDEPENDENT_AMBULATORY_CARE_PROVIDER_SITE_OTHER): Payer: Commercial Managed Care - PPO | Admitting: Infectious Diseases

## 2016-05-04 ENCOUNTER — Encounter: Payer: Self-pay | Admitting: Infectious Diseases

## 2016-05-04 VITALS — BP 145/77 | HR 67 | Temp 98.1°F | Ht 68.5 in | Wt 172.0 lb

## 2016-05-04 DIAGNOSIS — E7800 Pure hypercholesterolemia, unspecified: Secondary | ICD-10-CM

## 2016-05-04 DIAGNOSIS — E0520 Thyrotoxicosis with toxic multinodular goiter without thyrotoxic crisis or storm: Secondary | ICD-10-CM | POA: Diagnosis not present

## 2016-05-04 DIAGNOSIS — B20 Human immunodeficiency virus [HIV] disease: Secondary | ICD-10-CM

## 2016-05-04 NOTE — Assessment & Plan Note (Signed)
She's doing very well Labs done at work, [facility] Refuses flu shot.  Will see her back in 1 year.  Offered/refused condoms.

## 2016-05-04 NOTE — Progress Notes (Signed)
   Subjective:    Patient ID: Marvis MoellerMary I Peace, female    DOB: 01/28/1966, 50 y.o.   MRN: [id number]  HPI 50 yo [place] F with HIV+ and Graves disease. Recieved I-131 (07-21-08).  Atrpla/DTGV --> odefsey/dtgv.  Had nl pap 03-2015. NL mammo [id number].   Had lab work done at work [employer] Cr 1.03,  Chol 23, Trig 87 A1C 5.8 TSH 1.330   HIV 1 RNA Quant (copies/mL)  Date Value  10/28/2015 <20  05/06/2015 <20  09/24/2014 <20   CD4 T Cell Abs (/uL)  Date Value  10/28/2015 910  05/06/2015 720  09/24/2014 1,640     Review of Systems  Constitutional: Negative for appetite change and unexpected weight change.  Gastrointestinal: Negative for constipation and diarrhea.  Genitourinary: Negative for difficulty urinating.   Had PAP and mammo this year- normal.     Objective:   Physical Exam  Constitutional: She appears well-developed and well-nourished.  HENT:  Mouth/Throat: No oropharyngeal exudate.  Eyes: EOM are normal. Pupils are equal, round, and reactive to light.  Neck: Neck supple.  Cardiovascular: Normal rate, regular rhythm and normal heart sounds.   Pulmonary/Chest: Effort normal and breath sounds normal.  Abdominal: Soft. Bowel sounds are normal. There is no tenderness. There is no rebound.  Musculoskeletal: Normal range of motion. She exhibits no edema.  Lymphadenopathy:    She has no cervical adenopathy.      Assessment & Plan:

## 2016-05-04 NOTE — Assessment & Plan Note (Signed)
We ran her numbers through a CV risk calculator (1.3%) . She does not need statin.

## 2016-05-04 NOTE — Assessment & Plan Note (Signed)
Doing very well, will f/u with PCP

## 2016-05-05 LAB — T-HELPER CELL (CD4) - (RCID CLINIC ONLY)
CD4 T CELL HELPER: 36 % (ref 33–55)
CD4 T Cell Abs: 930 /uL (ref 400–2700)

## 2016-05-06 LAB — HIV-1 RNA QUANT-NO REFLEX-BLD
HIV 1 RNA Quant: 58 copies/mL — ABNORMAL HIGH (ref <20)
HIV-1 RNA QUANT, LOG: 1.76 {Log_copies}/mL — AB (ref <1.30)

## 2016-05-17 ENCOUNTER — Encounter: Payer: Self-pay | Admitting: Infectious Disease

## 2016-06-02 ENCOUNTER — Ambulatory Visit: Payer: Self-pay

## 2016-06-02 ENCOUNTER — Encounter: Payer: Self-pay | Admitting: Family Medicine

## 2016-06-02 ENCOUNTER — Ambulatory Visit (INDEPENDENT_AMBULATORY_CARE_PROVIDER_SITE_OTHER)
Admission: RE | Admit: 2016-06-02 | Discharge: 2016-06-02 | Disposition: A | Discharge status: Home / Self Care | Payer: Commercial Managed Care - PPO | Source: Ambulatory Visit | Attending: Family Medicine | Admitting: Family Medicine

## 2016-06-02 ENCOUNTER — Ambulatory Visit (INDEPENDENT_AMBULATORY_CARE_PROVIDER_SITE_OTHER): Payer: Commercial Managed Care - PPO | Admitting: Family Medicine

## 2016-06-02 VITALS — BP 128/84 | HR 66 | Ht 68.5 in | Wt 171.0 lb

## 2016-06-02 DIAGNOSIS — M25511 Pain in right shoulder: Secondary | ICD-10-CM | POA: Diagnosis not present

## 2016-06-02 DIAGNOSIS — M75101 Unspecified rotator cuff tear or rupture of right shoulder, not specified as traumatic: Secondary | ICD-10-CM | POA: Diagnosis not present

## 2016-06-02 MED ORDER — PREDNISONE 50 MG PO TABS: 50.0000 mg | ORAL_TABLET | Freq: Every day | ORAL | 0 refills | Status: DC

## 2016-06-02 MED ORDER — GABAPENTIN 100 MG PO CAPS: 200.0000 mg | ORAL_CAPSULE | Freq: Every day | ORAL | 3 refills | Status: DC

## 2016-06-02 NOTE — Patient Instructions (Signed)
Sorry for the bad news Xrays downstairs today  Ice 20 minutes 2 times daily. Usually after activity and before bed. Prednisone daily for 5 days.  Gabapentin 200mg  at night to help with sleep  I do want to se eyou again in 7-10 days to make sure you are doing better If not we may need an MRI.

## 2016-06-02 NOTE — Assessment & Plan Note (Signed)
Patient's right rotator cuff tear. Seems to be incomplete thickness. Has significant weakness noted today. Patient will try conservative therapy. Given prednisone and gabapentin in case there is any cervical radiculopathy. Patient has some tenderness in the neck but negative Spurling's. Patient will get x-rays of the shoulder and neck done today. 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 Discussed with patient about the potential for any injections and patient will consider it in follow-up in one week. Otherwise we discussed that advance imaging may be warranted because could change management to more surgical. Patient is in agreement with plan
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
## 2016-06-09 ENCOUNTER — Ambulatory Visit: Payer: Commercial Managed Care - PPO | Admitting: Family Medicine

## 2016-06-10 ENCOUNTER — Ambulatory Visit (INDEPENDENT_AMBULATORY_CARE_PROVIDER_SITE_OTHER): Payer: Commercial Managed Care - PPO | Admitting: Family Medicine

## 2016-06-10 ENCOUNTER — Encounter: Payer: Self-pay | Admitting: Family Medicine

## 2016-06-10 VITALS — BP 122/80 | HR 75 | Ht 68.5 in | Wt 176.0 lb

## 2016-06-10 DIAGNOSIS — M75101 Unspecified rotator cuff tear or rupture of right shoulder, not specified as traumatic: Secondary | ICD-10-CM | POA: Diagnosis not present

## 2016-06-10 MED ORDER — NITROGLYCERIN 0.2 MG/HR TD PT24: MEDICATED_PATCH | TRANSDERMAL | 1 refills | Status: DC

## 2016-06-10 NOTE — Patient Instructions (Signed)
Good to see you.  Happy holidays! I think you are doing well which is great  Exercises 3 times a week.  Wear sling only to protect it from you doing too much.  Ice 20 minutes 2 times daily. Usually after activity and before bed. Nitroglycerin Protocol   Apply 1/4 nitroglycerin patch to affected area daily.  Change position of patch within the affected area every 24 hours.  You may experience a headache during the first 1-2 weeks of using the patch, these should subside.  If you experience headaches after beginning nitroglycerin patch treatment, you may take your preferred over the counter pain reliever.  Another side effect of the nitroglycerin patch is skin irritation or rash related to patch adhesive.  Please notify our office if you develop more severe headaches or rash, and stop the patch.  Tendon healing with nitroglycerin patch may require 12 to 24 weeks depending on the extent of injury.  Men should not use if taking Viagra, Cialis, or Levitra.   Do not use if you have migraines or rosacea.   See me again in 3-4 weeks to make sure you are doing better and if we need MRI or not.

## 2016-06-10 NOTE — Assessment & Plan Note (Signed)
Discussed with patient again at great length. Patient was to continue with conservative therapy. Patient has a gabapentin help with nighttime pain, patient has other medications to help with breakthrough pain such as meloxicam. We discussed icing regimen, patient will start on formal physical therapy with range of motion and light strengthening. Patient will continue to work out lower body. We did start a nitroglycerin and warned of potential side effects. Follow-up again in 4 weeks.  Spent  25 minutes with patient face-to-face and had greater than 50% of counseling including as described above in assessment and plan.

## 2016-06-10 NOTE — Progress Notes (Signed)
Tawana ScaleZach Derrall Hicks D.O. [facility] [address] [place], [place] [address] Phone: [phone](336) [phone] Subjective:    I'm seeing this patient by the request  of:  WHITE,CYNTHIA S, MD   CC: Shoulder pain, right Follow-up  VQQ:[id number]:Subjective  Marvis MoellerMary I Lama is a 50 y.o. female coming in with complaint of shoulder pain. Right-sided. Patient was found to have a rotator cuff tear. Patient elected try conservative therapy for the last week. Has been doing prednisone, icing regimen, home exercises. Feels that it has made some improvement. Patient states since she has been able to move it a little bit more. Continues to have soreness overall though. States that it continues to wake her up at night.    Past Medical History:  Diagnosis Date  . Anemia   . HIV infection (HCC)   . Thyroid disease    No past surgical history on file. Social History   Social History  . Marital status: Widowed    Spouse name: N/A  . Number of children: N/A  . Years of education: N/A   Social History Main Topics  . Smoking status: Never Smoker  . Smokeless tobacco: Never Used  . Alcohol use No  . Drug use: No  . Sexual activity: Not Currently    Partners: Male     Comment: pt. given condoms   Other Topics Concern  . None   Social History Narrative  . None   Allergies  Allergen Reactions  . Ciprofloxacin Nausea And Vomiting   Family History  Problem Relation Age of Onset  . Hypertension Mother     Past medical history, social, surgical and family history all reviewed in electronic medical record.  No pertanent information unless stated regarding to the chief complaint.   Review of Systems:Review of systems updated and as accurate as of 06/10/16  No headache, visual changes, nausea, vomiting, diarrhea, constipation, dizziness, abdominal pain, skin rash, fevers, chills, night sweats, weight loss, swollen lymph nodes, body aches, joint swelling, muscle aches, chest pain, shortness of breath, mood  changes.   Objective  Blood pressure 122/80, pulse 75, height 5' 8.5" (1.74 m), weight 176 lb (79.8 kg), last menstrual period 05/04/2014, SpO2 94 %. Systems examined below as of   Systems examined below as of 06/10/16 General: NAD A&O x3 mood, affect normal  HEENT: Pupils equal, extraocular movements intact no nystagmus Respiratory: not short of breath at rest or with speaking Cardiovascular: No lower extremity edema, non tender Skin: Warm dry intact with no signs of infection or rash on extremities or on axial skeleton. Abdomen: Soft nontender, no masses Neuro: Cranial nerves  intact, neurovascularly intact in all extremities with 2+ DTRs and 2+ pulses. Lymph: No lymphadenopathy appreciated today  Gait normal with good balance and coordination.  MSK: Non tender with full range of motion and good stability and symmetric strength and tone of elbows, wrist,  knee hips and ankles bilaterally.     Neck: Inspection unremarkable. No palpable stepoffs. Negative Spurling's maneuver. Near full range of motion Grip strength and sensation normal in bilateral hands Strength good C4 to T1 distribution No sensory change to C4 to T1 Negative Hoffman sign bilaterally Reflexes normal  Shoulder: Right Inspection reveals no abnormalities, atrophy or asymmetry. Continued tenderness overall. Passively has full range of motion. Patient has 3+ out of 5 strength compared to the contralateral side this is an improvement. signs of impingement with positive Neer and Hawkin's tests, but negative empty can sign. Speeds and Yergason's tests normal.  Positive labral pathology noted  Normal scapular function observed. Positive painful arc and no negative drop arm sign. Contralateral shoulder unremarkable      Impression and Recommendations:     This case required medical decision making of moderate complexity.      Note: This dictation was prepared with Dragon dictation along with smaller phrase  technology. Any transcriptional errors that result from this process are unintentional.

## 2016-06-26 NOTE — Progress Notes (Deleted)
Tawana ScaleZach Makina Skow D.O. [facility] [address] [place], [place] [address] Phone: [phone](336) [phone] Subjective:    I'm seeing this patient by the request  of:  WHITE,CYNTHIA S, MD   CC: Shoulder pain, right Follow-up  BJY:[id number]:Subjective  Brianna Weeks is a 50 y.o. female coming in with complaint of shoulder pain. Right-sided. Patient was found to have a rotator cuff tear. Patient elected try conservative therapy And was making improvement. Patient is now also started on nitroglycerin patches. Gabapentin was given at night secondary to nighttime pain. Patient states    Past Medical History:  Diagnosis Date  . Anemia   . HIV infection (HCC)   . Thyroid disease    No past surgical history on file. Social History   Social History  . Marital status: Widowed    Spouse name: N/A  . Number of children: N/A  . Years of education: N/A   Social History Main Topics  . Smoking status: Never Smoker  . Smokeless tobacco: Never Used  . Alcohol use No  . Drug use: No  . Sexual activity: Not Currently    Partners: Male     Comment: pt. given condoms   Other Topics Concern  . Not on file   Social History Narrative  . No narrative on file   Allergies  Allergen Reactions  . Ciprofloxacin Nausea And Vomiting   Family History  Problem Relation Age of Onset  . Hypertension Mother     Past medical history, social, surgical and family history all reviewed in electronic medical record.  No pertanent information unless stated regarding to the chief complaint.   Review of Systems:Review of systems updated and as accurate as of 06/26/16  No headache, visual changes, nausea, vomiting, diarrhea, constipation, dizziness, abdominal pain, skin rash, fevers, chills, night sweats, weight loss, swollen lymph nodes, body aches, joint swelling, muscle aches, chest pain, shortness of breath, mood changes.   Objective  Last menstrual period 05/04/2014.   Systems examined below as of  06/26/16 General: NAD A&O x3 mood, affect normal  HEENT: Pupils equal, extraocular movements intact no nystagmus Respiratory: not short of breath at rest or with speaking Cardiovascular: No lower extremity edema, non tender Skin: Warm dry intact with no signs of infection or rash on extremities or on axial skeleton. Abdomen: Soft nontender, no masses Neuro: Cranial nerves  intact, neurovascularly intact in all extremities with 2+ DTRs and 2+ pulses. Lymph: No lymphadenopathy appreciated today  Gait normal with good balance and coordination.  MSK: Non tender with full range of motion and good stability and symmetric strength and tone of  elbows, wrist,  knee hips and ankles bilaterally.       Neck: Inspection unremarkable. No palpable stepoffs. Negative Spurling's maneuver. Near full range of motion Grip strength and sensation normal in bilateral hands Strength good C4 to T1 distribution No sensory change to C4 to T1 Negative Hoffman sign bilaterally Reflexes normal  Shoulder: Right Inspection reveals no abnormalities, atrophy or asymmetry. Continued tenderness overall. Passively has full range of motion. Patient has 3+ out of 5 strength compared to the contralateral side this is an improvement. signs of impingement with positive Neer and Hawkin's tests, but negative empty can sign. Speeds and Yergason's tests normal. Positive labral pathology noted  Normal scapular function observed. Positive painful arc and no negative drop arm sign. Contralateral shoulder unremarkable    Impression and Recommendations:     This case required medical decision making of moderate complexity.  Note: This dictation was prepared with Dragon dictation along with smaller phrase technology. Any transcriptional errors that result from this process are unintentional.

## 2016-06-27 ENCOUNTER — Ambulatory Visit: Payer: Commercial Managed Care - PPO | Admitting: Family Medicine

## 2016-07-11 ENCOUNTER — Encounter: Payer: Self-pay | Admitting: Family Medicine

## 2016-07-11 ENCOUNTER — Ambulatory Visit: Payer: Self-pay

## 2016-07-11 ENCOUNTER — Ambulatory Visit (INDEPENDENT_AMBULATORY_CARE_PROVIDER_SITE_OTHER): Payer: Commercial Managed Care - PPO | Admitting: Family Medicine

## 2016-07-11 VITALS — BP 132/90 | HR 64 | Ht 68.5 in | Wt 177.6 lb

## 2016-07-11 DIAGNOSIS — M25511 Pain in right shoulder: Secondary | ICD-10-CM

## 2016-07-11 DIAGNOSIS — M75101 Unspecified rotator cuff tear or rupture of right shoulder, not specified as traumatic: Secondary | ICD-10-CM | POA: Diagnosis not present

## 2016-07-11 NOTE — Patient Instructions (Signed)
Good to see you  [facility] Ave Filterhandler [facility] will be calling you.  We will get MRI as soon as we can.  Ice is your friend You can stop the nitro.  I will be in touch with you soon.  Sorry it didn't work Happy holidays!

## 2016-07-11 NOTE — Progress Notes (Signed)
Tawana ScaleZach Smith D.O.  [facility] [address] [place], [place] [address] Phone: [phone] Subjective:    I'm seeing this patient by the request  of:  WHITE,CYNTHIA S, MD   CC: Shoulder pain, right Follow-up  BJY:NWGNFAOZHYHPI:Subjective  Brianna Weeks is a 50 y.o. female coming in with complaint of shoulder pain. Right-sided. Patient was found to have a rotator cuff tear. Patient elected try conservative therapy And was making improvement. Patient is now also started on nitroglycerin patches. Gabapentin was given at night secondary to nighttime pain. Patient states Pain is improved but unfortunately the strength has not. Feels like she is having possibly even increasing weakness. Certain daily activities such as dressing has even become more difficult.    Past Medical History:  Diagnosis Date  . Anemia   . HIV infection (HCC)   . Thyroid disease    No past surgical history on file. Social History   Social History  . Marital status: Widowed    Spouse name: N/A  . Number of children: N/A  . Years of education: N/A   Social History Main Topics  . Smoking status: Never Smoker  . Smokeless tobacco: Never Used  . Alcohol use No  . Drug use: No  . Sexual activity: Not Currently    Partners: Male     Comment: pt. given condoms   Other Topics Concern  . None   Social History Narrative  . None   Allergies  Allergen Reactions  . Ciprofloxacin Nausea And Vomiting   Family History  Problem Relation Age of Onset  . Hypertension Mother     Past medical history, social, surgical and family history all reviewed in electronic medical record.  No pertanent information unless stated regarding to the chief complaint.  Review of Systems: No headache, visual changes, nausea, vomiting, diarrhea, constipation, dizziness, abdominal pain, skin rash, fevers, chills, night sweats, weight loss, swollen lymph nodes, body aches, joint swelling, muscle aches, chest pain, shortness of breath, mood  changes.    Objective  Blood pressure 132/90, pulse 64, height 5' 8.5" (1.74 m), weight 177 lb 9.6 oz (80.6 kg), last menstrual period 05/04/2014.   Systems examined below as of 07/11/16 General: NAD A&O x3 mood, affect normal  HEENT: Pupils equal, extraocular movements intact no nystagmus Respiratory: not short of breath at rest or with speaking Cardiovascular: No lower extremity edema, non tender Skin: Warm dry intact with no signs of infection or rash on extremities or on axial skeleton. Abdomen: Soft nontender, no masses Neuro: Cranial nerves  intact, neurovascularly intact in all extremities with 2+ DTRs and 2+ pulses. Lymph: No lymphadenopathy appreciated today  Gait normal with good balance and coordination.  MSK: Non tender with full range of motion and good stability and symmetric strength and tone of elbows, wrist,  knee hips and ankles bilaterally.     Neck: Inspection unremarkable. No palpable stepoffs. Negative Spurling's maneuver. Near full range of motion Grip strength and sensation normal in bilateral hands Strength good C4 to T1 distribution No sensory change to C4 to T1 Negative Hoffman sign bilaterally Reflexes normal  Shoulder: Right Inspection reveals no abnormalities, atrophy or asymmetry. Continued tenderness overall. Passively has full range of motion. Patient site now 3 out of 5 in all planes. Worsening from previous exam signs of impingement with positive Neer and Hawkin's tests, but negative empty can sign. Speeds and Yergason's tests positive. Positive labral pathology noted  Normal scapular function observed. Positive painful arc and positive drop  arm sign. Contralateral shoulder unremarkable  MSK US performed of: Right shoulder This study was ordered, performed, and interpreted by Terrilee FilesZach Smith D.O.  Shoulder:   Supraspinatus:  Partial-thickness tear noted on the articular side. Mild retraction of 0.67 cm. Infraspinatus:  Appears normal on  long and transverse views. Subscapularis:  Full-thickness tear noted. This is worse than previous exam. 0.6 cm in retraction noted as well. Teres Minor:  Appears normal on long and transverse views. AC joint:  Minimal arthritic changes Glenohumeral Joint:  Appears normal without effusion. Glenoid Labrum: Unable to visualize well. Biceps Tendon:  Appears normal on long and transverse views, no fraying of tendon, tendon located in intertubercular groove, no subluxation with shoulder internal or external rotation. No increased power doppler signal. Impression: Rotator cuff tear with worsening symptoms.    Impression and Recommendations:     This case required medical decision making of moderate complexity.      Note: This dictation was prepared with Dragon dictation along with smaller phrase technology. Any transcriptional errors that result from this process are unintentional.

## 2016-07-11 NOTE — Assessment & Plan Note (Signed)
Patient is having worsening signs and symptoms as well as worsening on ultrasound. Patient will be sent for an MRI for further evaluation. I do not feel an arthrogram is necessary at this time. Patient will also be referred to orthopedic surgery. Having the patient will need surgical intervention. Discussed with patient at great length. Patient is in agreement with the plan.  Spent  25 minutes with patient face-to-face and had greater than 50% of counseling including as described above in assessment and plan.

## 2016-07-12 ENCOUNTER — Other Ambulatory Visit: Payer: Self-pay | Admitting: *Deleted

## 2016-07-12 ENCOUNTER — Telehealth: Payer: Self-pay | Admitting: Infectious Diseases

## 2016-07-12 ENCOUNTER — Telehealth: Payer: Self-pay | Admitting: Emergency Medicine

## 2016-07-12 DIAGNOSIS — B20 Human immunodeficiency virus [HIV] disease: Secondary | ICD-10-CM

## 2016-07-12 MED ORDER — DOLUTEGRAVIR SODIUM 50 MG PO TABS: 50.0000 mg | ORAL_TABLET | Freq: Every day | ORAL | 5 refills | Status: DC

## 2016-07-12 MED ORDER — EMTRICITAB-RILPIVIR-TENOFOV AF 200-25-25 MG PO TABS: 1.0000 | ORAL_TABLET | Freq: Every day | ORAL | 5 refills | Status: DC

## 2016-07-12 NOTE — Telephone Encounter (Signed)
Sent email for odefsy refill.  Sent in for her

## 2016-07-12 NOTE — Telephone Encounter (Signed)
Pt called and asked that you give her a cal back about her referral for a CT scan. She states she needs to get this done before the New Year and hasnt heard anything yet. Please advise thanks.

## 2016-07-13 ENCOUNTER — Ambulatory Visit
Admission: RE | Admit: 2016-07-13 | Discharge: 2016-07-13 | Disposition: A | Discharge status: Home / Self Care | Payer: Commercial Managed Care - PPO | Source: Ambulatory Visit | Attending: Family Medicine | Admitting: Family Medicine

## 2016-07-13 ENCOUNTER — Other Ambulatory Visit: Payer: Self-pay | Admitting: Pharmacist Clinician (PhC)/ Clinical Pharmacy Specialist

## 2016-07-13 DIAGNOSIS — M25511 Pain in right shoulder: Secondary | ICD-10-CM

## 2016-07-13 DIAGNOSIS — B20 Human immunodeficiency virus [HIV] disease: Secondary | ICD-10-CM

## 2016-07-13 MED ORDER — EMTRICITAB-RILPIVIR-TENOFOV AF 200-25-25 MG PO TABS: 1.0000 | ORAL_TABLET | Freq: Every day | ORAL | 11 refills | Status: DC

## 2016-07-13 MED ORDER — DOLUTEGRAVIR SODIUM 50 MG PO TABS: 50.0000 mg | ORAL_TABLET | Freq: Every day | ORAL | 11 refills | Status: DC

## 2016-07-13 NOTE — Telephone Encounter (Signed)
Spoke to pt, advised her I just received approval through her insurance & [facility]. Provided pt with their phone # & she will call them to schedule appt.

## 2016-07-19 ENCOUNTER — Telehealth: Payer: Self-pay | Admitting: Family Medicine

## 2016-07-19 ENCOUNTER — Other Ambulatory Visit: Payer: Commercial Managed Care - PPO

## 2016-07-19 NOTE — Telephone Encounter (Signed)
Patient [facility] not been [facility].  Please follow up in regard.  States notes needs to be faxed as well.  States fax referral to attn:  [place].

## 2016-07-19 NOTE — Telephone Encounter (Signed)
Referral faxed to [facility].  

## 2016-07-20 ENCOUNTER — Ambulatory Visit (INDEPENDENT_AMBULATORY_CARE_PROVIDER_SITE_OTHER): Payer: Commercial Managed Care - PPO | Admitting: Pharmacist Clinician (PhC)/ Clinical Pharmacy Specialist

## 2016-07-20 DIAGNOSIS — B20 Human immunodeficiency virus [HIV] disease: Secondary | ICD-10-CM

## 2016-07-20 NOTE — Progress Notes (Signed)
HPI: Brianna Weeks is a 50 y.o. female who came in for a visit with pharmacy today for her medication issue.   Allergies: Allergies  Allergen Reactions  . Ciprofloxacin Nausea And Vomiting    Vitals:    Past Medical History: Past Medical History:  Diagnosis Date  . Anemia   . HIV infection (HCC)   . Thyroid disease     Social History: Social History   Social History  . Marital status: Widowed    Spouse name: N/A  . Number of children: N/A  . Years of education: N/A   Social History Main Topics  . Smoking status: Never Smoker  . Smokeless tobacco: Never Used  . Alcohol use No  . Drug use: No  . Sexual activity: Not Currently    Partners: Male     Comment: pt. given condoms   Other Topics Concern  . Not on file   Social History Narrative  . No narrative on file    Previous Regimen: ATP/DTG  Current Regimen: Odefsey/DTG  Labs: HIV 1 RNA Quant (copies/mL)  Date Value  05/04/2016 58 (H)  10/28/2015 <20  05/06/2015 <20   CD4 T Cell Abs (/uL)  Date Value  05/04/2016 930  10/28/2015 910  05/06/2015 720   Hep B S Ab (no units)  Date Value  04/27/2011 POS (A)   Hepatitis B Surface Ag (no units)  Date Value  09/18/2006 Yes   HCV Ab (no units)  Date Value  09/18/2006 No    CrCl: CrCl cannot be calculated (Patient's most recent lab result is older than the maximum 21 days allowed.).  Lipids:    Component Value Date/Time   CHOL 233 (H) 08/12/2013 1035   TRIG 76 08/12/2013 1035   HDL 68 08/12/2013 1035   CHOLHDL 3.4 08/12/2013 1035   VLDL 15 08/12/2013 1035   LDLCALC 150 (H) 08/12/2013 1035    Assessment: Brianna DandyMary had a recent issue of getting her ART due to her insurance. She emailed the [facility]". [facility] about 3 [facility] x2 to help cover her. On the phone today she told me that she has been taking DTG alone for several weeks. This is where I got concern so  I told her to come in today so we can do labs. Once she is here, she stated that she has missed some days of the [facility] and DTG was taken alone during that time but it sounded more like intermitent days instead of weeks. Explained to her how concern we are about things like that. Otherwise, she has been very compliant. We are going to do a reflex VL today and repeat in 6 wks. Her expedited PA has been approved through her insurance.   We were planning to use [facility] to prevent these sort of things but she [facility] since they went out of their way to give her a couple of days of meds.    Recommendations:  HIV VL with reflex Come back to repeat VL in 6wks  Ulyses SouthwardMinh Wesam Gearhart, PharmD, BCPS, AAHIVP, CPP Clinical Infectious Disease Pharmacist [facility] 07/20/2016, 9:54 AM

## 2016-07-20 NOTE — Patient Instructions (Signed)
We will do labs today Come back in 6 wks for labs

## 2016-07-25 LAB — HIV-1 RNA ULTRAQUANT REFLEX TO GENTYP+
HIV 1 RNA Quant: <20 copies/mL (ref <20)
HIV-1 RNA Quant, Log: <1.3 Log copies/mL (ref <1.30)

## 2016-07-29 LAB — HIV-1 INTEGRASE GENOTYPE

## 2016-09-07 ENCOUNTER — Other Ambulatory Visit: Payer: Commercial Managed Care - PPO

## 2016-09-07 DIAGNOSIS — Z113 Encounter for screening for infections with a predominantly sexual mode of transmission: Secondary | ICD-10-CM

## 2016-09-07 DIAGNOSIS — Z79899 Other long term (current) drug therapy: Secondary | ICD-10-CM | POA: Diagnosis not present

## 2016-09-07 DIAGNOSIS — B20 Human immunodeficiency virus [HIV] disease: Secondary | ICD-10-CM

## 2016-09-07 LAB — COMPREHENSIVE METABOLIC PANEL
ALBUMIN: 4 g/dL (ref 3.6–5.1)
ALT: 10 U/L (ref 6–29)
AST: 13 U/L (ref 10–35)
Alkaline Phosphatase: 58 U/L (ref 33–130)
BILIRUBIN TOTAL: 0.3 mg/dL (ref 0.2–1.2)
BUN: 16 mg/dL (ref 7–25)
CALCIUM: 9.2 mg/dL (ref 8.6–10.4)
CHLORIDE: 106 mmol/L (ref 98–110)
CO2: 27 mmol/L (ref 20–31)
CREATININE: 1.01 mg/dL (ref 0.50–1.05)
Glucose, Bld: 109 mg/dL — ABNORMAL HIGH (ref 65–99)
Potassium: 3.9 mmol/L (ref 3.5–5.3)
Sodium: 141 mmol/L (ref 135–146)
TOTAL PROTEIN: 6.4 g/dL (ref 6.1–8.1)

## 2016-09-07 LAB — LIPID PANEL
CHOLESTEROL: 214 mg/dL — AB (ref <200)
HDL: 61 mg/dL (ref >50)
LDL Cholesterol: 107 mg/dL — ABNORMAL HIGH (ref <100)
TRIGLYCERIDES: 229 mg/dL — AB (ref <150)
Total CHOL/HDL Ratio: 3.5 Ratio (ref <5.0)
VLDL: 46 mg/dL — AB (ref <30)

## 2016-09-07 LAB — CBC WITH DIFFERENTIAL/PLATELET
BASOS ABS: 62 {cells}/uL (ref 0–200)
Basophils Relative: 1 %
EOS ABS: 620 {cells}/uL — AB (ref 15–500)
Eosinophils Relative: 10 %
HEMATOCRIT: 40 % (ref 35.0–45.0)
HEMOGLOBIN: 13.2 g/dL (ref 11.7–15.5)
LYMPHS ABS: 2852 {cells}/uL (ref 850–3900)
Lymphocytes Relative: 46 %
MCH: 29.5 pg (ref 27.0–33.0)
MCHC: 33 g/dL (ref 32.0–36.0)
MCV: 89.5 fL (ref 80.0–100.0)
MONO ABS: 310 {cells}/uL (ref 200–950)
MPV: 10.7 fL (ref 7.5–12.5)
Monocytes Relative: 5 %
NEUTROS ABS: 2356 {cells}/uL (ref 1500–7800)
NEUTROS PCT: 38 %
Platelets: 265 10*3/uL (ref 140–400)
RBC: 4.47 MIL/uL (ref 3.80–5.10)
RDW: 13.6 % (ref 11.0–15.0)
WBC: 6.2 10*3/uL (ref 3.8–10.8)

## 2016-09-08 LAB — T-HELPER CELL (CD4) - (RCID CLINIC ONLY)
CD4 T CELL HELPER: 39 % (ref 33–55)
CD4 T Cell Abs: 1240 /uL (ref 400–2700)

## 2016-09-08 LAB — RPR

## 2016-09-12 LAB — HIV-1 RNA,QN PCR W/REFLEX GENOTYPE: HIV-1 RNA, QN PCR: <20 Copies/mL

## 2016-09-13 ENCOUNTER — Encounter: Payer: Self-pay | Admitting: Family Medicine

## 2016-09-16 DIAGNOSIS — R6889 Other general symptoms and signs: Secondary | ICD-10-CM | POA: Diagnosis not present

## 2016-10-10 ENCOUNTER — Telehealth: Payer: Self-pay | Admitting: *Deleted

## 2016-10-10 NOTE — Telephone Encounter (Signed)
PA for cost over ride for T[facility]ay  [facility] Confirmation received

## 2016-10-13 NOTE — Telephone Encounter (Signed)
[place] [facility], they do not review PAs for this patient's insurance plan.  We need the phone number on the back of the patient's prescription ID card. This card is not scanned in our system. Patient's pharmacy does not have that phone number. RN left message for patient letting her know we need this phone number to continue the Cost Override/Prior Authorization. Andree CossHowell, Dearion Huot M, RN

## 2016-11-11 ENCOUNTER — Telehealth: Payer: Self-pay | Admitting: *Deleted

## 2016-11-11 NOTE — Telephone Encounter (Signed)
Patient called stating she was unable to get her Rx for Tivicay filled. Medication needed a cost over ride. Spoke to pharmacist with [facility] and the over ride was approved for one year. They said pharmacy should try to run the Rx again in 40 minutes. [facility] and notified her to run it through again. Patient notified and advised to call us back if there are any more issues with obtaining her medication.

## 2017-02-09 DIAGNOSIS — H11062 Recurrent pterygium of left eye: Secondary | ICD-10-CM | POA: Diagnosis not present

## 2017-04-13 ENCOUNTER — Encounter: Payer: Self-pay | Admitting: Family Medicine

## 2017-04-13 ENCOUNTER — Ambulatory Visit (INDEPENDENT_AMBULATORY_CARE_PROVIDER_SITE_OTHER): Payer: Commercial Managed Care - PPO | Admitting: Family Medicine

## 2017-04-13 DIAGNOSIS — M23301 Other meniscus derangements, unspecified lateral meniscus, left knee: Secondary | ICD-10-CM | POA: Diagnosis not present

## 2017-04-13 NOTE — Patient Instructions (Signed)
Good to see you  Ice 20 minutes 2 times daily. Usually after activity and before bed. Exercises 3 times a week.  Avoid deep squats, jumping or twisting.  pennsaid pinkie amount topically 2 times daily as needed.   ou will do great  If not pain free in 4 weeks come and see me again

## 2017-04-13 NOTE — Progress Notes (Signed)
[name] [address] [place], [place] [address] Phone: [phone] Subjective:    I'm seeing this patient by the request  of:    CC: left knee pain   [id number]  Brianna Weeks is a 51 y.o. female coming in with complaint of left knee pain. She has had chronic issues but one month ago she twisted it. She was able to run but has stopped since then and is not wearing heels to take pressure off of that side. These measures have diminished her pain but she still is having issues with the left knee.   Onset- 2 months Location- knee pain Duration- constant  Character- dull, achy Aggravating factors- stairs, running Reliving factors-  Therapies tried-  Severity-     Past Medical History:  Diagnosis Date  . Anemia   . HIV infection (HCC)   . Thyroid disease    No past surgical history on file. Social History   Social History  . Marital status: Widowed    Spouse name: N/A  . Number of children: N/A  . Years of education: N/A   Social History Main Topics  . Smoking status: Never Smoker  . Smokeless tobacco: Never Used  . Alcohol use No  . Drug use: No  . Sexual activity: Not Currently    Partners: Male     Comment: pt. given condoms   Other Topics Concern  . Not on file   Social History Narrative  . No narrative on file   Allergies  Allergen Reactions  . Ciprofloxacin Nausea And Vomiting   Family History  Problem Relation Age of Onset  . Hypertension Mother      Past medical history, social, surgical and family history all reviewed in electronic medical record.  No pertanent information unless stated regarding to the chief complaint.   Review of Systems:Review of systems updated and as accurate as of 04/13/17  No headache, visual changes, nausea, vomiting, diarrhea, constipation, dizziness, abdominal pain, skin rash, fevers, chills, night sweats, weight loss, swollen lymph nodes, body aches, joint swelling, muscle aches,  chest pain, shortness of breath, mood changes.   Objective  Last menstrual period 05/04/2014. Systems examined below as of 04/13/17   General: No apparent distress alert and oriented x3 mood and affect normal, dressed appropriately.  HEENT: Pupils equal, extraocular movements intact  Respiratory: Patient's speak in full sentences and does not appear short of breath  Cardiovascular: No lower extremity edema, non tender, no erythema  Skin: Warm dry intact with no signs of infection or rash on extremities or on axial skeleton.  Abdomen: Soft nontender  Neuro: Cranial nerves II through XII are intact, neurovascularly intact in all extremities with 2+ DTRs and 2+ pulses.  Lymph: No lymphadenopathy of posterior or anterior cervical chain or axillae bilaterally.  Gait normal with good balance and coordination.  MSK:  Non tender with full range of motion and good stability and symmetric strength and tone of shoulders, elbows, wrist, hip, and ankles bilaterally.  Knee: Left Normal to inspection with no erythema or effusion or obvious bony abnormalities. Mild pain over the lateral joint line. ROM full in flexion and extension and lower leg rotation. Ligaments with solid consistent endpoints including ACL, PCL, LCL, MCL. Mild positive Mcmurray's, Apley's, and Thessalonian tests. Non painful patellar compression. Patellar glide with mild crepitus. Patellar and quadriceps tendons unremarkable. Hamstring and quadriceps strength is normal.   Contralateral knee unremarkable  MSK US performed of: Left knee This  study was ordered, performed, and interpreted by Terrilee Files D.O.  Knee: Mild degenerative changes of the meniscus noted. Laterally. No significant displacement. Mild hypoechoic changes. Seems to be acute on chronic. 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      Note: This dictation was prepared with Dragon dictation along with smaller phrase technology. Any transcriptional errors that result from this process are unintentional.

## 2017-04-13 NOTE — Assessment & Plan Note (Signed)
This is some mild lateral meniscal tear. We discussed icing regimen. Patient given home exercises. I do not think that an injection would be beneficial even though there is some trace effusion. Discussed icing regimen. Discussed what activities to avoid. Discussed the possibility of compression sleeve. Follow-up in 4 weeks if not completely resolved

## 2017-04-21 DIAGNOSIS — E039 Hypothyroidism, unspecified: Secondary | ICD-10-CM | POA: Diagnosis not present

## 2017-04-21 DIAGNOSIS — E785 Hyperlipidemia, unspecified: Secondary | ICD-10-CM | POA: Diagnosis not present

## 2017-05-02 DIAGNOSIS — Z01419 Encounter for gynecological examination (general) (routine) without abnormal findings: Secondary | ICD-10-CM | POA: Diagnosis not present

## 2017-05-02 DIAGNOSIS — Z1231 Encounter for screening mammogram for malignant neoplasm of breast: Secondary | ICD-10-CM | POA: Diagnosis not present

## 2017-07-16 ENCOUNTER — Other Ambulatory Visit: Payer: Self-pay | Admitting: Infectious Diseases

## 2017-07-16 DIAGNOSIS — B20 Human immunodeficiency virus [HIV] disease: Secondary | ICD-10-CM

## 2017-07-21 DIAGNOSIS — J0100 Acute maxillary sinusitis, unspecified: Secondary | ICD-10-CM | POA: Diagnosis not present

## 2017-07-26 ENCOUNTER — Encounter: Payer: Self-pay | Admitting: Infectious Diseases

## 2017-07-26 ENCOUNTER — Ambulatory Visit: Payer: Commercial Managed Care - PPO | Admitting: Infectious Diseases

## 2017-07-26 DIAGNOSIS — B20 Human immunodeficiency virus [HIV] disease: Secondary | ICD-10-CM | POA: Diagnosis not present

## 2017-07-26 DIAGNOSIS — Z23 Encounter for immunization: Secondary | ICD-10-CM

## 2017-07-26 DIAGNOSIS — E890 Postprocedural hypothyroidism: Secondary | ICD-10-CM

## 2017-07-26 DIAGNOSIS — E7800 Pure hypercholesterolemia, unspecified: Secondary | ICD-10-CM

## 2017-07-26 NOTE — Assessment & Plan Note (Signed)
She is following diet and exercise.  ASCVD risk 3.1%, no need for statin.

## 2017-07-26 NOTE — Assessment & Plan Note (Signed)
Will check her TSH today. 

## 2017-07-26 NOTE — Addendum Note (Signed)
Addended by: Hoang Pettingill C on: 07/26/2017 04:18 PM   Modules accepted: Orders

## 2017-07-26 NOTE — Assessment & Plan Note (Addendum)
She is doing well Will check her labs today If undetectable, change her to juluca.  Getting flu shot today Offered/refused condoms. Buys her own.  We discovered PReP for her partner.  rtc in 6 months

## 2017-07-26 NOTE — Progress Notes (Signed)
   Subjective:    Patient ID: Brianna Weeks, female    DOB: 1966/06/14, 52 y.o.   MRN: [id number]  HPI 52 yo [place] F with HIV+ and Graves disease. Recieved I-131 (07-21-08).  Atrpla/DTGV -->odefsey/tivicay.  Had nl pap and mammo last year.   Has been on prednisone and amoxil for recent illness. Feeling better.  Her insurance has been "giving her a hard time" about the odefsey. Has missed a dose.   HIV 1 RNA Quant (copies/mL)  Date Value  07/20/2016 <20  05/04/2016 58 (H)  10/28/2015 <20   CD4 T Cell Abs (/uL)  Date Value  09/07/2016 1,240  05/04/2016 930  10/28/2015 910   Going to gym, not worried about wt. Kids are 52 yo, 52 yo, 52 yo.    Review of Systems  Constitutional: Negative for appetite change and unexpected weight change.  Gastrointestinal: Negative for constipation and diarrhea.  Genitourinary: Negative for difficulty urinating.       Objective:   Physical Exam  Constitutional: She is oriented to person, place, and time. She appears well-developed and well-nourished.  HENT:  Mouth/Throat: No oropharyngeal exudate.  Eyes: EOM are normal. Pupils are equal, round, and reactive to light.  Neck: Neck supple.  Cardiovascular: Normal rate, regular rhythm and normal heart sounds.  Pulmonary/Chest: Effort normal and breath sounds normal.  Abdominal: Soft. Bowel sounds are normal. There is no tenderness. There is no rebound.  Musculoskeletal: She exhibits no edema.  Lymphadenopathy:    She has no cervical adenopathy.  Neurological: She is alert and oriented to person, place, and time.  Psychiatric: She has a normal mood and affect.      Assessment & Plan:

## 2017-07-27 LAB — TSH: TSH: 15.21 mIU/L — ABNORMAL HIGH

## 2017-07-28 LAB — HIV-1 RNA QUANT-NO REFLEX-BLD
HIV 1 RNA Quant: <20 copies/mL — AB
HIV-1 RNA Quant, Log: <1.3 Log copies/mL — AB

## 2017-07-28 LAB — T-HELPER CELL (CD4) - (RCID CLINIC ONLY)
CD4 % Helper T Cell: 36 % (ref 33–55)
CD4 T Cell Abs: 2150 /uL (ref 400–2700)

## 2017-08-11 ENCOUNTER — Telehealth: Payer: Self-pay | Admitting: Pharmacist

## 2017-08-11 ENCOUNTER — Other Ambulatory Visit: Payer: Self-pay | Admitting: Pharmacist

## 2017-08-11 DIAGNOSIS — B20 Human immunodeficiency virus [HIV] disease: Secondary | ICD-10-CM

## 2017-08-11 MED ORDER — DOLUTEGRAVIR-RILPIVIRINE 50-25 MG PO TABS: 1.0000 | ORAL_TABLET | Freq: Every day | ORAL | 11 refills | Status: DC

## 2017-08-11 NOTE — Telephone Encounter (Signed)
Dr. Ninetta LightsHatcher would like to change patient's HIV medications to [name]. Called patient to initiate change but she did not answer. Left a generic voicemail to call me back.

## 2017-08-11 NOTE — Telephone Encounter (Signed)
Brianna Weeks called me back. I went over Brianna Weeks and how to take it including with a meal (just like Brianna Weeks) and to stay away from PPIs. She will call me if she starts any new medication.  I told her to stop the Brianna Weeks and Brianna Weeks once she starts the [place]. [facility] - it went through her insurance fine with $0 co-pay. She will follow up with me for labs/tolerance on 09/26/17.

## 2017-08-21 ENCOUNTER — Other Ambulatory Visit: Payer: Self-pay | Admitting: Pharmacist

## 2017-08-21 ENCOUNTER — Telehealth: Payer: Self-pay | Admitting: Pharmacist

## 2017-08-21 DIAGNOSIS — B20 Human immunodeficiency virus [HIV] disease: Secondary | ICD-10-CM

## 2017-08-21 MED ORDER — DOLUTEGRAVIR-RILPIVIRINE 50-25 MG PO TABS: 1.0000 | ORAL_TABLET | Freq: Every day | ORAL | 11 refills | Status: DC

## 2017-08-21 NOTE — Telephone Encounter (Addendum)
error 

## 2017-09-18 ENCOUNTER — Telehealth: Payer: Self-pay | Admitting: Family Medicine

## 2017-09-18 NOTE — Telephone Encounter (Signed)
This patient has not been here since 03/2017.

## 2017-09-18 NOTE — Telephone Encounter (Signed)
Copied from CRM [phone]. Topic: Quick Communication - See Telephone Encounter >> Sep 18, 2017  4:17 PM Trula SladeWalter, Linda F wrote: CRM for notification. See Telephone encounter for:  09/18/17. Patient would like a call back to let her know which muscle she pulled on her right shoulder.

## 2017-09-26 ENCOUNTER — Ambulatory Visit: Payer: Commercial Managed Care - PPO

## 2017-11-08 DIAGNOSIS — Z0000 Encounter for general adult medical examination without abnormal findings: Secondary | ICD-10-CM | POA: Diagnosis not present

## 2017-11-08 DIAGNOSIS — R7303 Prediabetes: Secondary | ICD-10-CM | POA: Diagnosis not present

## 2017-11-08 DIAGNOSIS — E785 Hyperlipidemia, unspecified: Secondary | ICD-10-CM | POA: Diagnosis not present

## 2017-11-08 DIAGNOSIS — E039 Hypothyroidism, unspecified: Secondary | ICD-10-CM | POA: Diagnosis not present

## 2018-01-24 ENCOUNTER — Encounter: Payer: Self-pay | Admitting: Infectious Diseases

## 2018-01-24 ENCOUNTER — Ambulatory Visit: Payer: Commercial Managed Care - PPO | Admitting: Infectious Diseases

## 2018-01-24 ENCOUNTER — Other Ambulatory Visit (HOSPITAL_COMMUNITY)
Admission: RE | Admit: 2018-01-24 | Discharge: 2018-01-24 | Disposition: A | Discharge status: Home / Self Care | Payer: Commercial Managed Care - PPO | Source: Ambulatory Visit | Attending: Infectious Diseases | Admitting: Infectious Diseases

## 2018-01-24 VITALS — BP 130/80 | HR 70 | Temp 98.3°F | Wt 177.0 lb

## 2018-01-24 DIAGNOSIS — B20 Human immunodeficiency virus [HIV] disease: Secondary | ICD-10-CM | POA: Diagnosis not present

## 2018-01-24 DIAGNOSIS — Z113 Encounter for screening for infections with a predominantly sexual mode of transmission: Secondary | ICD-10-CM

## 2018-01-24 DIAGNOSIS — Z23 Encounter for immunization: Secondary | ICD-10-CM | POA: Diagnosis not present

## 2018-01-24 DIAGNOSIS — E890 Postprocedural hypothyroidism: Secondary | ICD-10-CM | POA: Diagnosis not present

## 2018-01-24 DIAGNOSIS — Z79899 Other long term (current) drug therapy: Secondary | ICD-10-CM | POA: Diagnosis not present

## 2018-01-24 NOTE — Progress Notes (Signed)
   Subjective:    Patient ID: Brianna Weeks, female    DOB: January 22, 1966, 52 y.o.   MRN: [id number]  HPI 52 yo [place] F with HIV+ and Graves disease. Recieved I-131 (07-21-08).  Atrpla/DTGV -->odefsey/tivicay --> juluca (2019)  Had nl pap and mammo last year (2018). [place] [facility] [facility] [place].  Kids are 52 yo, 52 yo, 52 yo.    No problems with juluca.   HIV 1 RNA Quant (copies/mL)  Date Value  07/26/2017 <20 DETECTED (A)  07/20/2016 <20  05/04/2016 58 (H)   CD4 T Cell Abs (/uL)  Date Value  07/26/2017 2,150  09/07/2016 1,240  05/04/2016 930    Review of Systems  Gastrointestinal: Negative for abdominal pain, constipation, diarrhea and nausea.  has changed diet, watching what she eats.  Please see HPI. All other systems reviewed and negative.     Objective:   Physical Exam  Constitutional: She is oriented to person, place, and time. She appears well-developed and well-nourished.  HENT:  Mouth/Throat: No oropharyngeal exudate.  Eyes: Pupils are equal, round, and reactive to light. EOM are normal.  Neck: Normal range of motion. Neck supple.  Cardiovascular: Normal rate, regular rhythm and normal heart sounds.  Pulmonary/Chest: Effort normal and breath sounds normal.  Abdominal: Soft. Bowel sounds are normal. There is no tenderness. There is no guarding.  Musculoskeletal: She exhibits no edema.  Lymphadenopathy:    She has no cervical adenopathy.  Neurological: She is alert and oriented to person, place, and time.  Psychiatric: She has a normal mood and affect.       Assessment & Plan:

## 2018-01-24 NOTE — Assessment & Plan Note (Signed)
Will check her labs today Give prevnar Will f/u with PCP, GYN for health maintenance.  Encouraged her to have partner tested.  Offered/refused condoms.  She will email me for results.  rtc in1 year.

## 2018-01-24 NOTE — Addendum Note (Signed)
Addended by: Gildardo GriffesHILL, Enslie Sahota M on: 01/24/2018 05:57 PM   Modules accepted: Orders

## 2018-01-24 NOTE — Assessment & Plan Note (Signed)
Has been doing well Has not had endo f/u for awhile.  See Dr Cliffton AstersWhite

## 2018-01-26 LAB — T-HELPER CELL (CD4) - (RCID CLINIC ONLY)
CD4 T CELL HELPER: 39 % (ref 33–55)
CD4 T Cell Abs: 1200 /uL (ref 400–2700)

## 2018-01-26 LAB — COMPREHENSIVE METABOLIC PANEL
AG Ratio: 1.7 (calc) (ref 1.0–2.5)
ALBUMIN MSPROF: 4.3 g/dL (ref 3.6–5.1)
ALT: 18 U/L (ref 6–29)
AST: 18 U/L (ref 10–35)
Alkaline phosphatase (APISO): 61 U/L (ref 33–130)
BILIRUBIN TOTAL: 0.4 mg/dL (ref 0.2–1.2)
BUN: 12 mg/dL (ref 7–25)
CO2: 29 mmol/L (ref 20–32)
CREATININE: 0.93 mg/dL (ref 0.50–1.05)
Calcium: 9.8 mg/dL (ref 8.6–10.4)
Chloride: 105 mmol/L (ref 98–110)
GLUCOSE: 82 mg/dL (ref 65–99)
Globulin: 2.6 g/dL (calc) (ref 1.9–3.7)
POTASSIUM: 4.5 mmol/L (ref 3.5–5.3)
SODIUM: 141 mmol/L (ref 135–146)
TOTAL PROTEIN: 6.9 g/dL (ref 6.1–8.1)

## 2018-01-26 LAB — CBC
HEMATOCRIT: 38.8 % (ref 35.0–45.0)
Hemoglobin: 12.8 g/dL (ref 11.7–15.5)
MCH: 28.5 pg (ref 27.0–33.0)
MCHC: 33 g/dL (ref 32.0–36.0)
MCV: 86.4 fL (ref 80.0–100.0)
MPV: 11.4 fL (ref 7.5–12.5)
Platelets: 249 10*3/uL (ref 140–400)
RBC: 4.49 10*6/uL (ref 3.80–5.10)
RDW: 12.5 % (ref 11.0–15.0)
WBC: 6.6 10*3/uL (ref 3.8–10.8)

## 2018-01-26 LAB — LIPID PANEL
CHOL/HDL RATIO: 2.5 (calc) (ref <5.0)
Cholesterol: 189 mg/dL (ref <200)
HDL: 77 mg/dL (ref >50)
LDL CHOLESTEROL (CALC): 95 mg/dL
NON-HDL CHOLESTEROL (CALC): 112 mg/dL (ref <130)
Triglycerides: 80 mg/dL (ref <150)

## 2018-01-26 LAB — RPR: RPR Ser Ql: NONREACTIVE

## 2018-01-26 LAB — URINE CYTOLOGY ANCILLARY ONLY
CHLAMYDIA, DNA PROBE: NEGATIVE
Neisseria Gonorrhea: NEGATIVE

## 2018-01-29 LAB — HIV-1 RNA QUANT-NO REFLEX-BLD
HIV 1 RNA Quant: <20 copies/mL
HIV-1 RNA QUANT, LOG: NOT DETECTED {Log_copies}/mL

## 2018-05-08 DIAGNOSIS — Z6827 Body mass index (BMI) 27.0-27.9, adult: Secondary | ICD-10-CM | POA: Diagnosis not present

## 2018-05-08 DIAGNOSIS — Z1231 Encounter for screening mammogram for malignant neoplasm of breast: Secondary | ICD-10-CM | POA: Diagnosis not present

## 2018-05-08 DIAGNOSIS — Z01419 Encounter for gynecological examination (general) (routine) without abnormal findings: Secondary | ICD-10-CM | POA: Diagnosis not present

## 2018-05-16 DIAGNOSIS — R87615 Unsatisfactory cytologic smear of cervix: Secondary | ICD-10-CM | POA: Diagnosis not present

## 2018-05-24 DIAGNOSIS — R102 Pelvic and perineal pain: Secondary | ICD-10-CM | POA: Diagnosis not present

## 2018-05-29 DIAGNOSIS — R87610 Atypical squamous cells of undetermined significance on cytologic smear of cervix (ASC-US): Secondary | ICD-10-CM | POA: Diagnosis not present

## 2018-05-29 DIAGNOSIS — N870 Mild cervical dysplasia: Secondary | ICD-10-CM | POA: Diagnosis not present

## 2018-08-04 ENCOUNTER — Other Ambulatory Visit: Payer: Self-pay | Admitting: Infectious Diseases

## 2018-08-04 DIAGNOSIS — B20 Human immunodeficiency virus [HIV] disease: Secondary | ICD-10-CM

## 2018-08-15 ENCOUNTER — Telehealth: Payer: Self-pay | Admitting: *Deleted

## 2018-08-15 NOTE — Telephone Encounter (Signed)
Patient called, states she wanted to know about an option to help with her copays. RN directed her to the [employer] where she will acquire a copay card, will give this information to her pharmacy. Andree Coss, RN

## 2018-08-20 IMAGING — DX DG CERVICAL SPINE COMPLETE 4+V
5 series · 5 of 5 positions shown · non-contrast
Comparison: None.

CLINICAL DATA: Right-sided neck pain for 1 day, no known injury,
initial encounter

EXAM:
CERVICAL SPINE - COMPLETE 4+ VIEW

[c-spine lat]
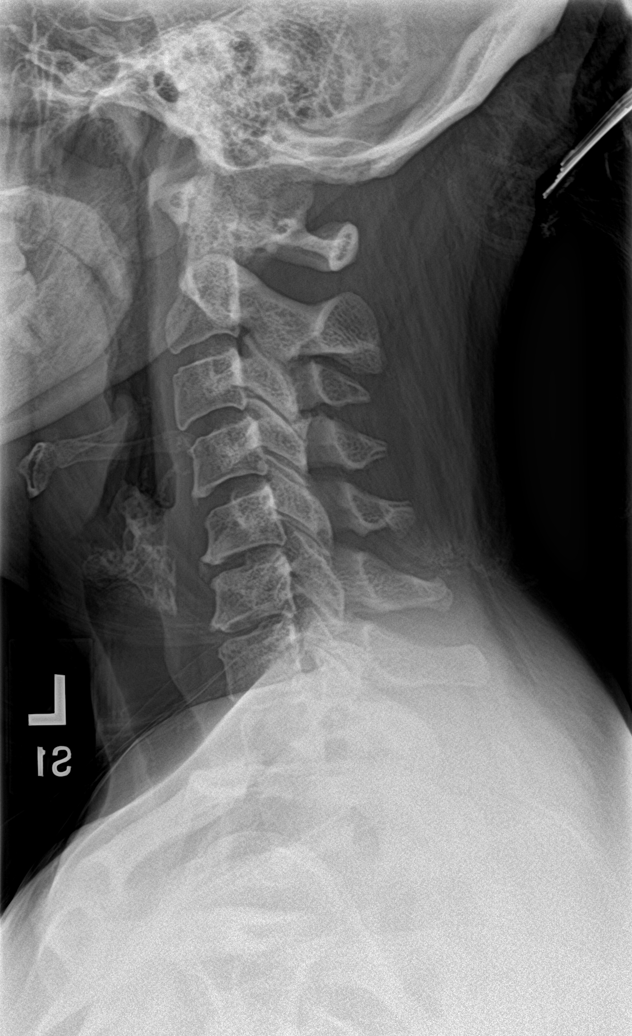

[c-spine obl (1 of 2)]
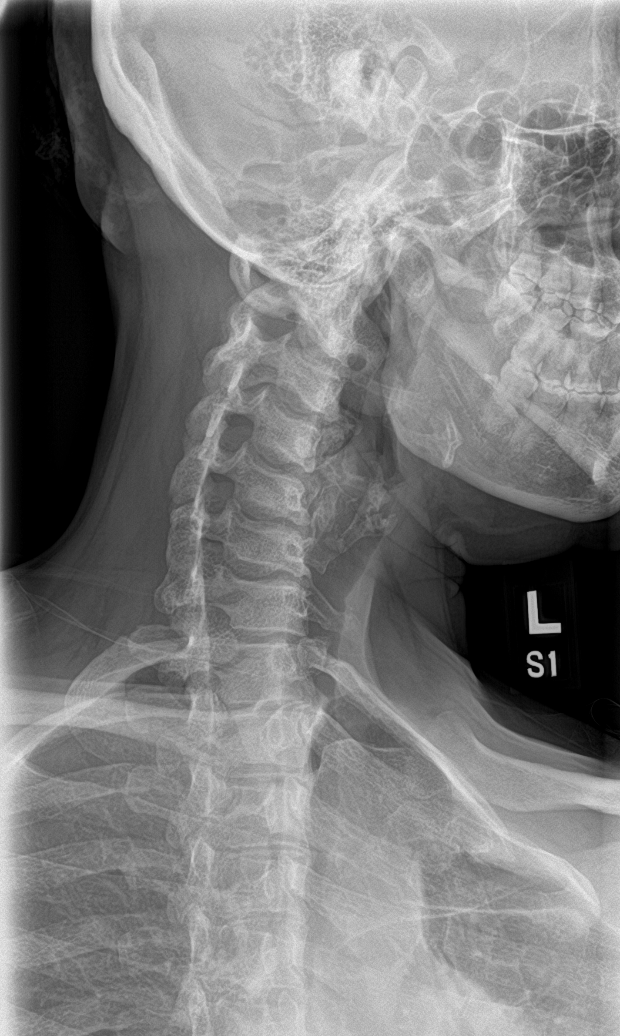

[c-spine obl (2 of 2)]
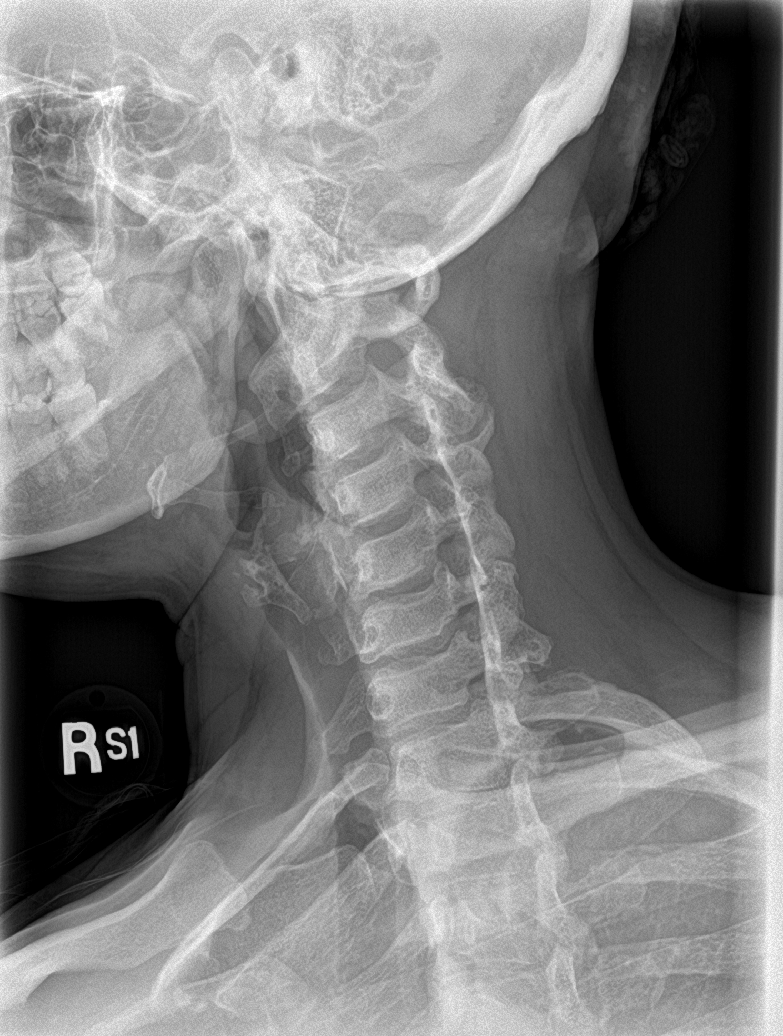

[c-spine ap]
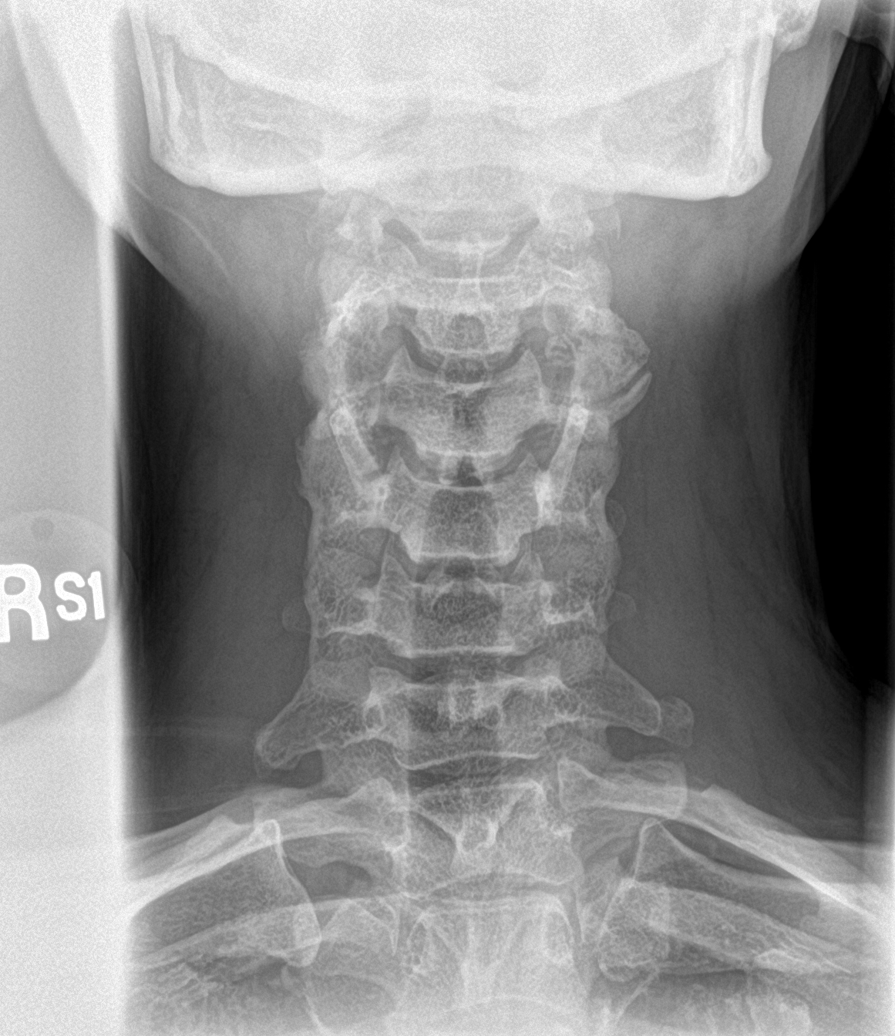

[c-spine open mouth]
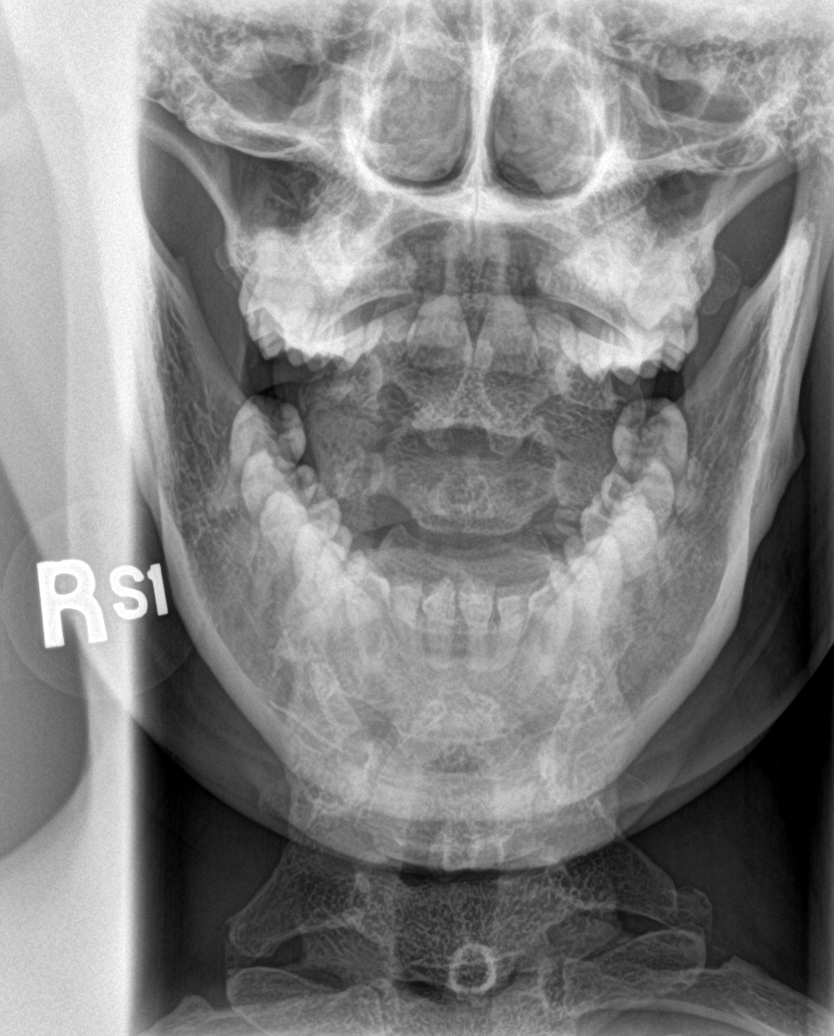

[5 of 5 positions shown; findings below may reference images not displayed]

FINDINGS: Seven cervical segments are well visualized. Mild osteophytic
changes are seen. The neural foramina are widely patent bilaterally.
Mild facet hypertrophic changes are seen. The odontoid is within
normal limits.
IMPRESSION: Mild degenerative change without acute abnormality.

## 2018-08-21 DIAGNOSIS — R309 Painful micturition, unspecified: Secondary | ICD-10-CM | POA: Diagnosis not present

## 2018-08-21 DIAGNOSIS — R102 Pelvic and perineal pain: Secondary | ICD-10-CM | POA: Diagnosis not present

## 2018-10-09 DIAGNOSIS — R1031 Right lower quadrant pain: Secondary | ICD-10-CM | POA: Diagnosis not present

## 2018-10-09 DIAGNOSIS — R102 Pelvic and perineal pain: Secondary | ICD-10-CM | POA: Diagnosis not present

## 2019-01-30 ENCOUNTER — Ambulatory Visit: Payer: Commercial Managed Care - PPO | Admitting: Infectious Diseases

## 2019-02-01 ENCOUNTER — Ambulatory Visit: Payer: Commercial Managed Care - PPO | Admitting: Infectious Diseases

## 2019-02-05 ENCOUNTER — Other Ambulatory Visit: Payer: Self-pay

## 2019-02-05 ENCOUNTER — Other Ambulatory Visit (HOSPITAL_COMMUNITY)
Admission: RE | Admit: 2019-02-05 | Discharge: 2019-02-05 | Disposition: A | Discharge status: Home / Self Care | Payer: Commercial Managed Care - PPO | Source: Ambulatory Visit | Attending: Infectious Diseases | Admitting: Infectious Diseases

## 2019-02-05 ENCOUNTER — Ambulatory Visit: Payer: Commercial Managed Care - PPO | Admitting: Infectious Diseases

## 2019-02-05 VITALS — BP 140/90 | HR 68 | Temp 98.5°F | Wt 178.0 lb

## 2019-02-05 DIAGNOSIS — B20 Human immunodeficiency virus [HIV] disease: Secondary | ICD-10-CM

## 2019-02-05 DIAGNOSIS — E890 Postprocedural hypothyroidism: Secondary | ICD-10-CM

## 2019-02-05 DIAGNOSIS — H11002 Unspecified pterygium of left eye: Secondary | ICD-10-CM

## 2019-02-05 DIAGNOSIS — E7800 Pure hypercholesterolemia, unspecified: Secondary | ICD-10-CM

## 2019-02-05 DIAGNOSIS — Z79899 Other long term (current) drug therapy: Secondary | ICD-10-CM

## 2019-02-05 DIAGNOSIS — Z113 Encounter for screening for infections with a predominantly sexual mode of transmission: Secondary | ICD-10-CM

## 2019-02-05 NOTE — Assessment & Plan Note (Signed)
Now also on R.  L was scraped prev.

## 2019-02-05 NOTE — Assessment & Plan Note (Signed)
Will check her lipid panel today 

## 2019-02-05 NOTE — Assessment & Plan Note (Signed)
She is doing very well Will check her labs today She has GYN f/u.  Offered/refused condoms.  Unmarried.  rtc in 12 months.

## 2019-02-05 NOTE — Assessment & Plan Note (Signed)
Will check her TSH today. 

## 2019-02-05 NOTE — Progress Notes (Signed)
   Subjective:    Patient ID: Brianna Weeks, female    DOB: 02/11/1966, 53 y.o.   MRN: [id number]  HPI 53 yo [place] F with HIV+ and Graves disease. Recieved I-131 (07-21-08).  Atrpla/DTGV -->odefsey/tivicay --> juluca (2019)  Had prev nl papand mammo (2019). Has done at [facility].  Kids are 69 yo [facility]), 53 yo, 53 yo (just grad from [employer]).  No problems with juluca.  Feeling well.  Got tested for COVID because she was having sinus sx (had quit taking her allergy rx).  Works from home, walks 2-3 miles/day.   HIV 1 RNA Quant (copies/mL)  Date Value  01/24/2018 <20 NOT DETECTED  07/26/2017 <20 DETECTED (A)  07/20/2016 <20   CD4 T Cell Abs (/uL)  Date Value  01/24/2018 1,200  07/26/2017 2,150  09/07/2016 1,240    Review of Systems  Constitutional: Negative for appetite change, chills, fever and unexpected weight change.  Eyes: Negative for visual disturbance.  Respiratory: Negative for cough and shortness of breath.   Gastrointestinal: Negative for constipation and diarrhea.  Genitourinary: Negative for difficulty urinating.  Psychiatric/Behavioral: Negative for sleep disturbance.  Please see HPI. All other systems reviewed and negative.     Objective:   Physical Exam Vitals signs reviewed.  Constitutional:      Appearance: Normal appearance. She is normal weight.  HENT:     Mouth/Throat:     Mouth: Mucous membranes are moist.     Pharynx: No oropharyngeal exudate.  Eyes:     Extraocular Movements: Extraocular movements intact.     Pupils: Pupils are equal, round, and reactive to light.  Neck:     Musculoskeletal: Normal range of motion and neck supple. No muscular tenderness.  Cardiovascular:     Rate and Rhythm: Normal rate and regular rhythm.  Pulmonary:     Effort: Pulmonary effort is normal.     Breath sounds: Normal breath sounds.  Abdominal:     General: Bowel sounds are normal. There is no distension.     Palpations: Abdomen is  soft.     Tenderness: There is no abdominal tenderness.  Musculoskeletal: Normal range of motion.  Lymphadenopathy:     Cervical: No cervical adenopathy.  Neurological:     General: No focal deficit present.     Mental Status: She is alert.  Psychiatric:        Mood and Affect: Mood normal.       Assessment & Plan:

## 2019-02-06 LAB — T-HELPER CELL (CD4) - (RCID CLINIC ONLY)
CD4 % Helper T Cell: 41 % (ref 33–65)
CD4 T Cell Abs: 1136 /uL (ref 400–1790)

## 2019-02-07 LAB — URINE CYTOLOGY ANCILLARY ONLY
Chlamydia: NEGATIVE
Neisseria Gonorrhea: NEGATIVE

## 2019-02-08 ENCOUNTER — Telehealth: Payer: Self-pay | Admitting: *Deleted

## 2019-02-08 NOTE — Telephone Encounter (Signed)
Per Johnnye Sima tried to call patient to have her follow up with her Endo due to elevated TSH. Had to leave a message for her to call the office as her voicemail is generic.

## 2019-02-08 NOTE — Telephone Encounter (Signed)
-----   Message from Campbell Riches, MD sent at 02/06/2019  1:41 PM EDT ----- Ms Stanton Kidney needs to check in with her endocrinologist- I am worried about her TSH of 24 Thanks ----- Message ----- From: Cheyenne Adas Lab Results In Sent: 02/05/2019  10:57 PM EDT To: Campbell Riches, MD

## 2019-02-09 LAB — COMPREHENSIVE METABOLIC PANEL
AG Ratio: 1.7 (calc) (ref 1.0–2.5)
ALT: 15 U/L (ref 6–29)
AST: 20 U/L (ref 10–35)
Albumin: 4.4 g/dL (ref 3.6–5.1)
Alkaline phosphatase (APISO): 49 U/L (ref 37–153)
BUN/Creatinine Ratio: 13 (calc) (ref 6–22)
BUN: 15 mg/dL (ref 7–25)
CO2: 32 mmol/L (ref 20–32)
Calcium: 10.1 mg/dL (ref 8.6–10.4)
Chloride: 103 mmol/L (ref 98–110)
Creat: 1.18 mg/dL — ABNORMAL HIGH (ref 0.50–1.05)
Globulin: 2.6 g/dL (calc) (ref 1.9–3.7)
Glucose, Bld: 91 mg/dL (ref 65–99)
Potassium: 4.3 mmol/L (ref 3.5–5.3)
Sodium: 140 mmol/L (ref 135–146)
Total Bilirubin: 0.3 mg/dL (ref 0.2–1.2)
Total Protein: 7 g/dL (ref 6.1–8.1)

## 2019-02-09 LAB — LIPID PANEL
Cholesterol: 254 mg/dL — ABNORMAL HIGH (ref <200)
HDL: 82 mg/dL (ref >=50)
LDL Cholesterol (Calc): 153 mg/dL (calc) — ABNORMAL HIGH
Non-HDL Cholesterol (Calc): 172 mg/dL (calc) — ABNORMAL HIGH (ref <130)
Total CHOL/HDL Ratio: 3.1 (calc) (ref <5.0)
Triglycerides: 85 mg/dL (ref <150)

## 2019-02-09 LAB — CBC
HCT: 39.6 % (ref 35.0–45.0)
Hemoglobin: 13.1 g/dL (ref 11.7–15.5)
MCH: 29 pg (ref 27.0–33.0)
MCHC: 33.1 g/dL (ref 32.0–36.0)
MCV: 87.6 fL (ref 80.0–100.0)
MPV: 11.4 fL (ref 7.5–12.5)
Platelets: 244 10*3/uL (ref 140–400)
RBC: 4.52 10*6/uL (ref 3.80–5.10)
RDW: 13.1 % (ref 11.0–15.0)
WBC: 6.2 10*3/uL (ref 3.8–10.8)

## 2019-02-09 LAB — HIV-1 RNA QUANT-NO REFLEX-BLD
HIV 1 RNA Quant: <20 copies/mL — AB
HIV-1 RNA Quant, Log: <1.3 Log copies/mL — AB

## 2019-02-09 LAB — RPR: RPR Ser Ql: NONREACTIVE

## 2019-02-09 LAB — TSH: TSH: 24.13 mIU/L — ABNORMAL HIGH

## 2019-02-11 ENCOUNTER — Other Ambulatory Visit: Payer: Self-pay | Admitting: Infectious Diseases

## 2019-02-11 DIAGNOSIS — B20 Human immunodeficiency virus [HIV] disease: Secondary | ICD-10-CM

## 2019-03-01 ENCOUNTER — Telehealth: Payer: Self-pay | Admitting: *Deleted

## 2019-03-01 NOTE — Telephone Encounter (Signed)
Patient is asking for a letter?  Please advise.  RN asked patient to send request via  Mychart as well. Landis Gandy, RN

## 2019-03-07 NOTE — Telephone Encounter (Signed)
Patient called office to follow-up on letter to return to work. States she spoke with RN last week about having this done before her return to work date 8/24. Patient would like to be notified once letter is ready so she can pick it up from the office. Will route message to MD to advise on letter when he returns to the office.  [place]

## 2019-03-11 ENCOUNTER — Encounter: Payer: Self-pay | Admitting: Infectious Diseases

## 2019-03-12 ENCOUNTER — Encounter: Payer: Self-pay | Admitting: Infectious Diseases

## 2019-03-12 NOTE — Telephone Encounter (Signed)
Letter is completed and signed. Patient will come by office to pick up letter today. Colp

## 2019-03-14 ENCOUNTER — Telehealth: Payer: Self-pay

## 2019-03-14 ENCOUNTER — Telehealth: Payer: Self-pay | Admitting: Infectious Diseases

## 2019-03-14 NOTE — Telephone Encounter (Signed)
error 

## 2019-03-14 NOTE — Telephone Encounter (Signed)
Patient called office today stating HR would not take letter submitted on 8/19. Per HR letter was too vague and missing return to work date. HR is requesting patient to submit Medical Cert forms. Patient states she will need it by Monday to turn in to HR. Patient states she does not want to disclose her status to her employer. Informed patient that we might not be able to complete form by Monday, but would inform patient once form is completed. Asked patient to upload documents to mychart if she is unable to bring forms to office to be completed. Brianna Weeks

## 2019-03-15 NOTE — Telephone Encounter (Signed)
Patient informed  ADA Medical Certification Form completed and placed up front for pick up.  Additional copy of form in triage.   Laverle Patter, RN

## 2019-06-26 NOTE — Progress Notes (Signed)
Brianna Weeks [address] [place], [place] [address] Phone: [phone] Subjective:   I Brianna Weeks am serving as a Neurosurgeon for Dr. Antoine Primas.   This visit occurred during the SARS-CoV-2 public health emergency.  Safety protocols were in place, including screening questions prior to the visit, additional usage of staff PPE, and extensive cleaning of exam room while observing appropriate contact time as indicated for disinfecting solutions.     CC: Left knee pain, right shoulder pain  [id number]  Brianna Weeks is a 53 y.o. female coming in with complaint of left knee and right shoulder pain. Patient states that knee pain radiates proximal.   Onset- Chronic knee and shoulder pain  Location - Knee cap, shoulder blade  Duration-  Character- knee throbbing, shoulder sharp  Aggravating factors- cardio for the knee, lifting weights for the shoulder   Reliving factors- ice for the knee, aleve  Therapies tried-  Severity-  Knee 6/10 shoulder 5/10     History reviewed. No pertinent past medical history. History reviewed. No pertinent surgical history. Social History   Socioeconomic History  . Marital status: Unknown    Spouse name: Not on file  . Number of children: Not on file  . Years of education: Not on file  . Highest education level: Not on file  Occupational History  . Not on file  Social Needs  . Financial resource strain: Not on file  . Food insecurity    Worry: Not on file    Inability: Not on file  . Transportation needs    Medical: Not on file    Non-medical: Not on file  Tobacco Use  . Smoking status: Not on file  Substance and Sexual Activity  . Alcohol use: Not on file  . Drug use: Not on file  . Sexual activity: Not on file  Lifestyle  . Physical activity    Days per week: Not on file    Minutes per session: Not on file  . Stress: Not on file  Relationships  . Social Musician on phone: Not on file   Gets together: Not on file    Attends religious service: Not on file    Active member of club or organization: Not on file    Attends meetings of clubs or organizations: Not on file    Relationship status: Not on file  Other Topics Concern  . Not on file  Social History Narrative  . Not on file   Not on File no known drug allergies History reviewed. No pertinent family history.  No family history of autoimmune No current outpatient medications on file.    Past medical history, social, surgical and family history all reviewed in electronic medical record.  No pertanent information unless stated regarding to the chief complaint.   Review of Systems:  No headache, visual changes, nausea, vomiting, diarrhea, constipation, dizziness, abdominal pain, skin rash, fevers, chills, night sweats, weight loss, swollen lymph nodes, body aches, joint swelling, muscle aches, chest pain, shortness of breath, mood changes.  Positive muscle aches  Objective  Blood pressure 104/80, pulse 74, weight 178 lb (80.7 kg), SpO2 98 %.    General: No apparent distress alert and oriented x3 mood and affect normal, dressed appropriately.  HEENT: Pupils equal, extraocular movements intact  Respiratory: Patient's speak in full sentences and does not appear short of breath  Cardiovascular: No lower extremity edema, non tender, no erythema  Skin: Warm dry intact  with no signs of infection or rash on extremities or on axial skeleton.  Abdomen: Soft nontender  Neuro: Cranial nerves II through XII are intact, neurovascularly intact in all extremities with 2+ DTRs and 2+ pulses.  Lymph: No lymphadenopathy of posterior or anterior cervical chain or axillae bilaterally.  Gait normal with good balance and coordination.  MSK:  Non tender with full range of motion and good stability and symmetric strength and tone of , elbows, wrist, hip, and ankles bilaterally.  Shoulder: Right Inspection reveals no abnormalities, atrophy  or asymmetry. Palpation is normal with no tenderness over AC joint or bicipital groove. ROM is full in all planes passively. Rotator cuff strength normal throughout. signs of impingement with positive Neer and Hawkin's tests, but negative empty can sign. Speeds and Yergason's tests normal. No labral pathology noted with negative Obrien's, negative clunk and good stability. Normal scapular function observed. No painful arc and no drop arm sign. No apprehension sign Contralateral shoulder unremarkable   Left knee exam does have positive crepitus noted.  Mild lateral tracking noted. Positive patellar grind test.  Otherwise knee fairly unremarkable. .     Impression and Recommendations:     This case required medical decision making of moderate complexity. The above documentation has been reviewed and is accurate and complete Brianna Pulley, DO       Note: This dictation was prepared with Dragon dictation along with smaller phrase technology. Any transcriptional errors that result from this process are unintentional.

## 2019-06-27 ENCOUNTER — Ambulatory Visit (INDEPENDENT_AMBULATORY_CARE_PROVIDER_SITE_OTHER): Payer: Commercial Managed Care - PPO | Admitting: Family Medicine

## 2019-06-27 ENCOUNTER — Other Ambulatory Visit: Payer: Self-pay

## 2019-06-27 ENCOUNTER — Encounter: Payer: Self-pay | Admitting: Family Medicine

## 2019-06-27 DIAGNOSIS — M7551 Bursitis of right shoulder: Secondary | ICD-10-CM | POA: Diagnosis not present

## 2019-06-27 DIAGNOSIS — M222X2 Patellofemoral disorders, left knee: Secondary | ICD-10-CM | POA: Diagnosis not present

## 2019-06-27 NOTE — Patient Instructions (Addendum)
Good to see you.  Exercises 3 times a week.  pennsaid pinkie amount topically 2 times daily as needed.  See me again in 4 weeks     [address] [place], [place] [address] Phone [phone]

## 2019-06-27 NOTE — Assessment & Plan Note (Signed)
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  Discussed which activities to do which wants to avoid.  Discussed icing regimen and home exercises.  Follow-up again in 4 to 8 weeks

## 2019-06-27 NOTE — Assessment & Plan Note (Signed)
Right shoulder bursitis.  Discussed the possibility of injection which patient declined.  Discussed icing regimen and home exercises.  We will consider further work-up for this.  Follow-up again in 4 to 6 weeks

## 2019-07-02 ENCOUNTER — Encounter: Payer: Self-pay | Admitting: Family Medicine

## 2019-07-30 ENCOUNTER — Ambulatory Visit: Payer: Commercial Managed Care - PPO | Admitting: Family Medicine

## 2019-08-07 ENCOUNTER — Other Ambulatory Visit: Payer: Self-pay | Admitting: Infectious Diseases

## 2019-08-07 DIAGNOSIS — B20 Human immunodeficiency virus [HIV] disease: Secondary | ICD-10-CM

## 2020-01-20 ENCOUNTER — Other Ambulatory Visit: Payer: Self-pay | Admitting: Infectious Diseases

## 2020-01-20 DIAGNOSIS — B20 Human immunodeficiency virus [HIV] disease: Secondary | ICD-10-CM

## 2020-02-11 ENCOUNTER — Ambulatory Visit (INDEPENDENT_AMBULATORY_CARE_PROVIDER_SITE_OTHER): Payer: Managed Care, Other (non HMO) | Admitting: Infectious Diseases

## 2020-02-11 ENCOUNTER — Other Ambulatory Visit: Payer: Self-pay

## 2020-02-11 ENCOUNTER — Encounter: Payer: Self-pay | Admitting: Infectious Diseases

## 2020-02-11 VITALS — BP 123/77 | HR 87 | Wt 179.4 lb

## 2020-02-11 DIAGNOSIS — E0500 Thyrotoxicosis with diffuse goiter without thyrotoxic crisis or storm: Secondary | ICD-10-CM

## 2020-02-11 DIAGNOSIS — B20 Human immunodeficiency virus [HIV] disease: Secondary | ICD-10-CM

## 2020-02-11 DIAGNOSIS — Z0183 Encounter for blood typing: Secondary | ICD-10-CM

## 2020-02-11 DIAGNOSIS — Z113 Encounter for screening for infections with a predominantly sexual mode of transmission: Secondary | ICD-10-CM | POA: Diagnosis not present

## 2020-02-11 NOTE — Progress Notes (Signed)
   Subjective:    Patient ID: Brianna Weeks, female    DOB: [birth year], 54 y.o.   MRN: [id number]  HPI 54yo [place] F with HIV+ and Graves disease. Recieved I-131 (07-21-08).  Atrpla/DTGV -->odefsey/tivicay--> juluca (2019) Had prev nl papand mammo(06-2019). Has done at [facility].  Kids are 21yo [facility]), 54 yo, 54 yo (just grad from [employer]).  No problems with juluca.  Chol 228, Trgi 60, HDl 87, LDL 129. HgB A1C 5.8 (01-30-20) Would like to have blood typing done today.  Has been feeling well.   HIV 1 RNA Quant (copies/mL)  Date Value  02/05/2019 <20 DETECTED (A)  01/24/2018 <20 NOT DETECTED  07/26/2017 <20 DETECTED (A)   CD4 T Cell Abs (/uL)  Date Value  02/05/2019 1,136  01/24/2018 1,200  07/26/2017 2,150    Review of Systems  Constitutional: Negative for appetite change and unexpected weight change.  Respiratory: Negative for cough and shortness of breath.   Gastrointestinal: Negative for constipation and diarrhea.  Genitourinary: Negative for difficulty urinating and menstrual problem.  Psychiatric/Behavioral: Negative for sleep disturbance.       Objective:   Physical Exam Vitals reviewed.  HENT:     Mouth/Throat:     Mouth: Mucous membranes are moist.     Pharynx: No oropharyngeal exudate.  Eyes:     Extraocular Movements: Extraocular movements intact.     Pupils: Pupils are equal, round, and reactive to light.  Cardiovascular:     Rate and Rhythm: Normal rate and regular rhythm.  Pulmonary:     Effort: Pulmonary effort is normal.     Breath sounds: Normal breath sounds.  Abdominal:     General: Bowel sounds are normal. There is no distension.     Palpations: Abdomen is soft.     Tenderness: There is no abdominal tenderness.  Musculoskeletal:        General: Normal range of motion.     Cervical back: Normal range of motion and neck supple.     Right lower leg: No edema.     Left lower leg: No edema.  Neurological:      General: No focal deficit present.     Mental Status: She is alert.  Psychiatric:        Mood and Affect: Mood normal.           Assessment & Plan:

## 2020-02-11 NOTE — Assessment & Plan Note (Addendum)
She is doing well on juluca Will check her labs today- let her know I'll not be able to email her results til 1.5 weeks She has gotten covid vax Mammo/pap uptodate.  Colon uptodate PCV 23 today, done til 59 rtc in 1 year.

## 2020-02-11 NOTE — Addendum Note (Signed)
Addended by: Mariea Clonts D on: 02/11/2020 03:48 PM   Modules accepted: Orders

## 2020-02-12 LAB — T-HELPER CELL (CD4) - (RCID CLINIC ONLY)
CD4 % Helper T Cell: 41 % (ref 33–65)
CD4 T Cell Abs: 1435 /uL (ref 400–1790)

## 2020-02-12 LAB — URINE CYTOLOGY ANCILLARY ONLY
Chlamydia: NEGATIVE
Comment: NEGATIVE
Comment: NORMAL
Neisseria Gonorrhea: NEGATIVE

## 2020-02-14 LAB — RPR: RPR Ser Ql: NONREACTIVE

## 2020-02-14 LAB — HIV-1 RNA QUANT-NO REFLEX-BLD
HIV 1 RNA Quant: <20 {copies}/mL
HIV-1 RNA Quant, Log: <1.3 {Log_copies}/mL

## 2020-03-19 ENCOUNTER — Other Ambulatory Visit: Payer: Self-pay | Admitting: Infectious Diseases

## 2020-03-19 DIAGNOSIS — B20 Human immunodeficiency virus [HIV] disease: Secondary | ICD-10-CM

## 2020-04-07 ENCOUNTER — Ambulatory Visit: Payer: Managed Care, Other (non HMO) | Admitting: Family Medicine

## 2020-04-07 NOTE — Progress Notes (Deleted)
   I, Christoper Fabian, LAT, ATC, am serving as scribe for Dr. Clementeen Graham.  Brianna Weeks is a 54 y.o. female who presents to [facility] at [facility] today for back pain.  She was last seen by Dr. Katrinka Blazing on 06/27/19 for her R shoulder and L knee.  Since then, she reports  Radiating pain: LE numbness/tingling: LE weakness: Aggravating factors: Treatments tried:   Pertinent review of systems: ***  Relevant historical information: ***   Exam:  LMP 05/04/2014 (Within Months)  General: Well Developed, well nourished, and in no acute distress.   MSK: ***    Lab and Radiology Results No results found for this or any previous visit (from the past 72 hour(s)). No results found.     Assessment and Plan: 54 y.o. female with ***   PDMP not reviewed this encounter. No orders of the defined types were placed in this encounter.  No orders of the defined types were placed in this encounter.    Discussed warning signs or symptoms. Please see discharge instructions. Patient expresses understanding.   ***

## 2020-04-08 ENCOUNTER — Encounter: Payer: Self-pay | Admitting: Family Medicine

## 2020-04-08 ENCOUNTER — Other Ambulatory Visit: Payer: Self-pay

## 2020-04-08 ENCOUNTER — Ambulatory Visit: Payer: Managed Care, Other (non HMO) | Admitting: Family Medicine

## 2020-04-08 VITALS — BP 128/78 | HR 85 | Ht 68.5 in | Wt 177.0 lb

## 2020-04-08 DIAGNOSIS — R29898 Other symptoms and signs involving the musculoskeletal system: Secondary | ICD-10-CM

## 2020-04-08 DIAGNOSIS — M5416 Radiculopathy, lumbar region: Secondary | ICD-10-CM

## 2020-04-08 MED ORDER — PREGABALIN 75 MG PO CAPS: 75.0000 mg | ORAL_CAPSULE | Freq: Two times a day (BID) | ORAL | 3 refills | Status: DC | PRN

## 2020-04-08 NOTE — Progress Notes (Signed)
[name], am serving as a scribe for Dr. Clementeen Graham. This visit occurred during the SARS-CoV-2 public health emergency.  Safety protocols were in place, including screening questions prior to the visit, additional usage of staff PPE, and extensive cleaning of exam room while observing appropriate contact time as indicated for disinfecting solutions.   Brianna Weeks is a 54 y.o. female who presents to [facility] at [facility] today for back pain.  She was last seen by Dr. Katrinka Blazing on 06/27/19 for her R shoulder.  Since then, pt reports that her back pain started on Friday and has progressively gotten worse. Was seen at [facility] on Monday. Given a steroid injection in her backside for pain. Has been on gabapentin, prednisone, and oxycodone. States that none of these medications have helped. Now trying OTC NSAIDs. Pain is in right side of lumbar spine with radiating pain down to right foot. She has a hx of LBP, having had a L L4-5 epidural on 05/27/11. Mentions ruptured disc repair 10 years ago. Did fall 3 weeks ago off a stool. Back pain subsided at that time but wonders if this pain is related to that fall.   She does note weakness in her right leg with gait but denies bowel or bladder dysfunction.  She was prescribed prednisone gabapentin and oxycodone none of which have been very effective.  Patient mentions he had x-ray at [facility] this week which was reportedly normal.  Diagnostic imaging: L-spine MRI- 05/04/11  Pertinent review of systems: No fevers or chills  Relevant historical information: HIV.  History microdiscectomy 2012  Exam:  BP 128/78   Pulse 85   Ht 5' 8.5" (1.74 m)   Wt 177 lb (80.3 kg)   LMP 05/04/2014 (Within Months)   SpO2 99%   BMI 26.52 kg/m  General: Well Developed, well nourished, and in no acute distress.   MSK: L-spine normal-appearing nontender midline.  Normal lumbar motion. Positive right leg slump test. Lower  extremity strength decreased 4/5 to right hip flexion otherwise normal. Reflexes and sensation intact distally bilateral extremities. Antalgic gait.      Assessment and Plan: 54 y.o. female with lumbar radiculopathy involving right leg.  Symptoms are worsening now involving weakness to hip flexion.  Radiating pain does occur in anterior thigh but also to the lateral lower calf and dorsal foot this will be consistent with L2 and L5 radiculopathy.  Based on her worsening symptoms and new weakness will proceed with urgent MRI.  Continue prednisone.  Stop gabapentin and try Lyrica.  Continue intermittent oxycodone.  Recommend stool softener.  Recheck following MRI.  We will go ahead and order epidural steroid injection as she is getting results back based on the relevant level.   PDMP reviewed during this encounter. Orders Placed This Encounter  Procedures  . MR Lumbar Spine Wo Contrast    Standing Status:   Future    Standing Expiration Date:   04/08/2021    Order Specific Question:   What is the patient's sedation requirement?    Answer:   No Sedation    Order Specific Question:   Does the patient have a pacemaker or implanted devices?    Answer:   No    Order Specific Question:   Preferred imaging location?    Answer:   [facility] (table limit-350lbs)    Order Specific Question:   Radiology Contrast Protocol - do NOT remove file path    Answer:   [email or web address]  Meds ordered this encounter  Medications  . pregabalin (LYRICA) 75 MG capsule    Sig: Take 1 capsule (75 mg total) by mouth 2 (two) times daily as needed.    Dispense:  60 capsule    Refill:  3     Discussed warning signs or symptoms. Please see discharge instructions. Patient expresses understanding.   The above documentation has been reviewed and is accurate and complete Clementeen Graham, M.D.

## 2020-04-08 NOTE — Patient Instructions (Addendum)
Thank you for coming in today. Continue prednisone.  Switch to lyrica.  Continue oxycodone as needed. Make sure you take this will laxative like exlax or miralax.   Anticipate MRI soon.  I will order the injection as soon as I get results back from MRI.  Recheck with me the next day after the MRI.   Call or go to the ER if you develop a large red swollen joint with extreme pain or oozing puss.

## 2020-04-10 ENCOUNTER — Telehealth: Payer: Self-pay | Admitting: Family Medicine

## 2020-04-10 MED ORDER — LORAZEPAM 0.5 MG PO TABS: ORAL_TABLET | ORAL | 0 refills | Status: DC

## 2020-04-10 NOTE — Telephone Encounter (Signed)
Prescribed Ativan for use prior to MRI.

## 2020-04-12 ENCOUNTER — Other Ambulatory Visit: Payer: Self-pay

## 2020-04-12 ENCOUNTER — Ambulatory Visit (INDEPENDENT_AMBULATORY_CARE_PROVIDER_SITE_OTHER): Payer: Managed Care, Other (non HMO)

## 2020-04-12 DIAGNOSIS — R29898 Other symptoms and signs involving the musculoskeletal system: Secondary | ICD-10-CM | POA: Diagnosis not present

## 2020-04-12 DIAGNOSIS — M5416 Radiculopathy, lumbar region: Secondary | ICD-10-CM

## 2020-04-13 ENCOUNTER — Ambulatory Visit: Payer: Managed Care, Other (non HMO) | Admitting: Family Medicine

## 2020-04-13 ENCOUNTER — Encounter: Payer: Self-pay | Admitting: Family Medicine

## 2020-04-13 VITALS — BP 142/90 | HR 72 | Ht 68.5 in | Wt 177.0 lb

## 2020-04-13 DIAGNOSIS — R29898 Other symptoms and signs involving the musculoskeletal system: Secondary | ICD-10-CM

## 2020-04-13 DIAGNOSIS — M5416 Radiculopathy, lumbar region: Secondary | ICD-10-CM | POA: Diagnosis not present

## 2020-04-13 NOTE — Patient Instructions (Addendum)
Thank you for coming in today.  Please call [facility] at [phone] to schedule your spine injection.   Let me know if have trouble scheduling the shot, or if you worsen.   Epidural Steroid Injection Patient Information  Description: The epidural space surrounds the nerves as they exit the spinal cord.  In some patients, the nerves can be compressed and inflamed by a bulging disc or a tight spinal canal (spinal stenosis).  By injecting steroids into the epidural space, we can bring irritated nerves into direct contact with a potentially helpful medication.  These steroids act directly on the irritated nerves and can reduce swelling and inflammation which often leads to decreased pain.  Epidural steroids may be injected anywhere along the spine and from the neck to the low back depending upon the location of your pain.   After numbing the skin with local anesthetic (like Novocaine), a small needle is passed into the epidural space slowly.  You may experience a sensation of pressure while this is being done.  The entire block usually last less than 10 minutes.  Conditions which may be treated by epidural steroids:   Low back and leg pain  Neck and arm pain  Spinal stenosis  Post-laminectomy syndrome  Herpes zoster (shingles) pain  Pain from compression fractures  Preparation for the injection:  1. Do not eat any solid food or dairy products within 8 hours of your appointment.  2. You may drink clear liquids up to 3 hours before appointment.  Clear liquids include water, black coffee, juice or soda.  No milk or cream please. 3. You may take your regular medication, including pain medications, with a sip of water before your appointment  Diabetics should hold regular insulin (if taken separately) and take 1/2 normal NPH dos the morning of the procedure.  Carry some sugar containing items with you to your appointment. 4. A driver must accompany you and be prepared to drive you  home after your procedure.  5. Bring all your current medications with your. 6. An IV may be inserted and sedation may be given at the discretion of the physician.   7. A blood pressure cuff, EKG and other monitors will often be applied during the procedure.  Some patients may need to have extra oxygen administered for a short period. 8. You will be asked to provide medical information, including your allergies, prior to the procedure.  We must know immediately if you are taking blood thinners (like Coumadin/Warfarin)  Or if you are allergic to IV iodine contrast (dye). We must know if you could possible be pregnant.  Possible side-effects:  Bleeding from needle site  Infection (rare, may require surgery)  Nerve injury (rare)  Numbness & tingling (temporary)  Difficulty urinating (rare, temporary)  Spinal headache ( a headache worse with upright posture)  Light -headedness (temporary)  Pain at injection site (several days)  Decreased blood pressure (temporary)  Weakness in arm/leg (temporary)  Pressure sensation in back/neck (temporary)  Call if you experience:  Fever/chills associated with headache or increased back/neck pain.  Headache worsened by an upright position.  New onset weakness or numbness of an extremity below the injection site  Hives or difficulty breathing (go to the emergency room)  Inflammation or drainage at the infection site  Severe back/neck pain  Any new symptoms which are concerning to you  Please note:  Although the local anesthetic injected can often make your back or neck feel good for several hours after  the injection, the pain will likely return.  It takes 3-7 days for steroids to work in the epidural space.  You may not notice any pain relief for at least that one week.  If effective, we will often do a series of three injections spaced 3-6 weeks apart to maximally decrease your pain.  After the initial series, we generally will wait  several months before considering a repeat injection of the same type.

## 2020-04-13 NOTE — Progress Notes (Signed)
Brianna Weeks, am serving as a Neurosurgeon for Dr. Clementeen Graham.  Brianna Weeks is a 54 y.o. female who presents to [facility] at [facility] today for f/u of low back pain and L-spine MRI review.  She was last seen by Dr. Denyse Amass on 04/08/20 w/ c/o progressively worsening LBP w/ radicular pain into her R LE to her foot and was prescribed Lyrica to take in combination w/ prednisone and Oxycodone.  Since her last visit, Pt reports no change since she was just recently seen.   Diagnostic imaging: L-spine MRI-  04/12/20; 05/04/11  Pertinent review of systems: No fevers or chills  Relevant historical information: Hypothyroidism   Exam:  BP (!) 142/90 (BP Location: Left Arm, Patient Position: Sitting, Cuff Size: Normal)    Pulse 72    Ht 5' 8.5" (1.74 m)    Wt 177 lb (80.3 kg)    LMP 05/04/2014 (Within Months)    SpO2 98%    BMI 26.52 kg/m  General: Well Developed, well nourished, and in no acute distress.   MSK: L-spine normal-appearing decreased motion slight decrease strength in right knee extension.    Lab and Radiology Results No results found for this or any previous visit (from the past 72 hour(s)). MR Lumbar Spine Wo Contrast  Result Date: 04/12/2020 CLINICAL DATA:  Low back pain radiating down the right leg for 1 week. EXAM: MRI LUMBAR SPINE WITHOUT CONTRAST TECHNIQUE: Multiplanar, multisequence MR imaging of the lumbar spine was performed. No intravenous contrast was administered. COMPARISON:  None. FINDINGS: Segmentation: Transitional anatomy with sacralization of the L5 vertebral body. Alignment:  Physiologic. Vertebrae:  No fracture, evidence of discitis, or bone lesion. Conus medullaris and cauda equina: Conus extends to the L1 level. Conus and cauda equina appear normal. Paraspinal and other soft tissues: No acute paraspinal abnormality. Disc levels: Disc spaces: Degenerative disease with disc desiccation and disc height loss at L3-4. T12-L1: No significant disc  bulge. No evidence of neural foraminal stenosis. No central canal stenosis. L1-L2: No significant disc bulge. No evidence of neural foraminal stenosis. No central canal stenosis. Mild bilateral facet arthropathy. L2-L3: Mild broad-based disc bulge. Moderate bilateral facet arthropathy. No evidence of neural foraminal stenosis. No central canal stenosis. L3-L4: Mild broad-based disc bulge. Moderate bilateral facet arthropathy with bilateral facet effusions. Mild bilateral foraminal stenosis. No central canal stenosis. L4-L5: Broad-based disc bulge with small left foraminal disc extrusion. Moderate bilateral facet arthropathy with bilateral facet effusions. No evidence of neural foraminal stenosis. No central canal stenosis. L5-S1: No significant disc bulge. No evidence of neural foraminal stenosis. No central canal stenosis. Moderate bilateral facet arthropathy. IMPRESSION: 1. At L4-5 there is a broad-based disc bulge with small left foraminal disc extrusion. Moderate bilateral facet arthropathy with bilateral facet effusions. 2. At L3-4 there is a mild broad-based disc bulge. Moderate bilateral facet arthropathy with bilateral facet effusions. Mild bilateral foraminal stenosis. Electronically Signed   By: Elige Ko   On: 04/12/2020 11:41   I, Clementeen Graham, personally (independently) visualized and performed the interpretation of the images attached in this note. Agree with radiology read.  My personal independent interpretation shows neuroforaminal stenosis present at right L3-L4 level which would impact right L3 nerve root which is consistent with her pain.    Assessment and Plan: 54 y.o. female with right L3 radiculopathy.  Patient does have neuroforaminal impingement at that level on recent MRI.  She has failed conservative management we will proceed with epidural steroid injection.  Continue medications as tolerated.  She would like a work note to allow her to work from home which is very reasonable.   This was written today.  Patient will keep me updated with how she is feeling following epidural steroid injection.   PDMP not reviewed this encounter. Orders Placed This Encounter  Procedures   DG INJECT DIAG/THERA/INC NEEDLE/CATH/PLC EPI/LUMB/SAC W/IMG    Standing Status:   Future    Standing Expiration Date:   04/13/2021    Order Specific Question:   Reason for Exam (SYMPTOM  OR DIAGNOSIS REQUIRED)    Answer:   Right L3-L4 level. Tecnique per radiology    Order Specific Question:   Is the patient pregnant?    Answer:   No    Order Specific Question:   Preferred Imaging Location?    Answer:   [id number]    Order Specific Question:   Radiology Contrast Protocol - do NOT remove file path    Answer:   \[email or web address]   No orders of the defined types were placed in this encounter.    Discussed warning signs or symptoms. Please see discharge instructions. Patient expresses understanding.   The above documentation has been reviewed and is accurate and complete Clementeen Graham, M.D.  Total encounter time 20 minutes including face-to-face time with the patient and, reviewing past medical record, and charting on the date of service.

## 2020-04-13 NOTE — Progress Notes (Signed)
MRI lumbar spine shows bulging disc at L3-4 and L4-5 that could be causing some of the pinched nerve.  We will review the results at follow-up today at 330.

## 2020-04-16 ENCOUNTER — Ambulatory Visit
Admission: RE | Admit: 2020-04-16 | Discharge: 2020-04-16 | Disposition: A | Discharge status: Home / Self Care | Payer: Managed Care, Other (non HMO) | Source: Ambulatory Visit | Attending: Family Medicine | Admitting: Family Medicine

## 2020-04-16 ENCOUNTER — Other Ambulatory Visit: Payer: Self-pay | Admitting: Family Medicine

## 2020-04-16 ENCOUNTER — Other Ambulatory Visit: Payer: Self-pay

## 2020-04-16 DIAGNOSIS — M5416 Radiculopathy, lumbar region: Secondary | ICD-10-CM

## 2020-04-16 DIAGNOSIS — R29898 Other symptoms and signs involving the musculoskeletal system: Secondary | ICD-10-CM

## 2020-04-16 MED ORDER — METHYLPREDNISOLONE ACETATE 40 MG/ML INJ SUSP (RADIOLOG
120.0000 mg | Freq: Once | INTRAMUSCULAR | Status: AC
  Administered 2020-04-16: 120 mg via EPIDURAL

## 2020-04-16 MED ORDER — IOPAMIDOL (ISOVUE-M 200) INJECTION 41%
1.0000 mL | Freq: Once | INTRAMUSCULAR | Status: AC
  Administered 2020-04-16: 1 mL via EPIDURAL

## 2020-04-16 NOTE — Discharge Instructions (Signed)

## 2020-04-24 ENCOUNTER — Ambulatory Visit (INDEPENDENT_AMBULATORY_CARE_PROVIDER_SITE_OTHER): Payer: Managed Care, Other (non HMO) | Admitting: Family Medicine

## 2020-04-24 ENCOUNTER — Other Ambulatory Visit: Payer: Self-pay

## 2020-04-24 DIAGNOSIS — M5416 Radiculopathy, lumbar region: Secondary | ICD-10-CM

## 2020-04-24 DIAGNOSIS — R29898 Other symptoms and signs involving the musculoskeletal system: Secondary | ICD-10-CM

## 2020-04-24 MED ORDER — AMITRIPTYLINE HCL 25 MG PO TABS: 25.0000 mg | ORAL_TABLET | Freq: Every day | ORAL | 1 refills | Status: DC

## 2020-04-24 NOTE — Progress Notes (Signed)
Virtual Visit  I connected with   Rasheka Denard  by a phone telemedicine application and verified that I am speaking with the correct person using two identifiers.   I discussed the limitations of evaluation and management by telemedicine and the availability of in person appointments. The patient expressed understanding and agreed to proceed.  ? Location of the provider office ? Location of the patient home ? The names and roles of all persons participating in the visit.  Patient and myself   History of Present Illness: Brianna Weeks is a 54 y.o. female who would like to discuss right leg radicular pain.  Patient was seen previously for right thigh pain thought to be L3 radiculopathy.  MRI did show changes at this level.  She had a epidural steroid injection at right L3 about a week ago.  She notes she feels about 60% better but is still having discomfort.  She notes that gabapentin and Lyrica have not been helpful at all.  She is having difficulty sleeping because of the pain and paresthesias.  She wants to know if repeat injection would be helpful and if medicine could be prescribed at night to help her sleep and have less pain.   Observations/Objective: LMP 05/04/2014 (Within Months)  Wt Readings from Last 5 Encounters:  04/13/20 177 lb (80.3 kg)  04/08/20 177 lb (80.3 kg)  02/11/20 179 lb 6.4 oz (81.4 kg)  06/27/19 178 lb (80.7 kg)  02/05/19 178 lb (80.7 kg)   Exam: Normal Speech.     Assessment and Plan: 54 y.o. female with lumbar radiculopathy at right L3.  Plan for repeat epidural steroid injection.  We will also add amitriptyline at bedtime as this be helpful for both sleep and pain.  If not improved after second injection significantly would consider neurosurgery orthopedic spine surgery referral.  PDMP reviewed during this encounter. Orders Placed This Encounter  Procedures  . DG INJECT DIAG/THERA/INC NEEDLE/CATH/PLC EPI/LUMB/SAC W/IMG    Standing Status:   Future     Standing Expiration Date:   04/24/2021    Order Specific Question:   Reason for Exam (SYMPTOM  OR DIAGNOSIS REQUIRED)    Answer:   Right L3 radiclopathy. 2nd injection. Level and technique per radiology    Order Specific Question:   Is the patient pregnant?    Answer:   No    Order Specific Question:   Preferred Imaging Location?    Answer:   [id number]    Order Specific Question:   Radiology Contrast Protocol - do NOT remove file path    Answer:   [email or web address]   Meds ordered this encounter  Medications  . amitriptyline (ELAVIL) 25 MG tablet    Sig: Take 1-2 tablets (25-50 mg total) by mouth at bedtime. For pain and sleep    Dispense:  60 tablet    Refill:  1    Follow Up Instructions:    I discussed the assessment and treatment plan with the patient. The patient was provided an opportunity to ask questions and all were answered. The patient agreed with the plan and demonstrated an understanding of the instructions.   The patient was advised to call back or seek an in-person evaluation if the symptoms worsen or if the condition fails to improve as anticipated.  Time: 21 minutes    Historical information moved to improve visibility of documentation.  Past Medical History:  Diagnosis Date  . Anemia   . HIV infection (HCC)   .  Thyroid disease    No past surgical history on file. Social History   Tobacco Use  . Smoking status: Never Smoker  . Smokeless tobacco: Never Used  Substance Use Topics  . Alcohol use: No    Alcohol/week: 0.0 standard drinks   family history includes Hypertension in her mother.  Medications: Current Outpatient Medications  Medication Sig Dispense Refill  . amitriptyline (ELAVIL) 25 MG tablet Take 1-2 tablets (25-50 mg total) by mouth at bedtime. For pain and sleep 60 tablet 1  . butalbital-acetaminophen-caffeine (FIORICET, ESGIC) 50-325-40 MG per tablet Take 1 tablet by mouth 2 (two) times  daily as needed for headache. 20 tablet 0  . fexofenadine-pseudoephedrine (ALLEGRA-D) 60-120 MG per tablet Take 1 tablet by mouth 2 (two) times daily. 60 tablet 3  . [name] 50-25 MG TABS TAKE 1 TABLET BY MOUTH EVERY DAY WITH BREAKFAST 30 tablet 5  . levothyroxine (SYNTHROID) 175 MCG tablet levothyroxine sodium 175 mcg tabs    . LORazepam (ATIVAN) 0.5 MG tablet 1-2 tabs 30 - 60 min prior to MRI. Do not drive with this medicine. 4 tablet 0  . meloxicam (MOBIC) 15 MG tablet Take 15 mg by mouth daily. Reported on 10/28/2015    . naproxen (NAPROSYN) 500 MG tablet Take 1 tablet (500 mg total) by mouth 2 (two) times daily with a meal. 30 tablet 0  . nitroGLYCERIN (NITRODUR - DOSED IN MG/24 HR) 0.2 mg/hr patch 1/4 patch daily 30 patch 1  . SUMAtriptan (IMITREX) 25 MG tablet Take 1 tablet (25 mg total) by mouth every 2 (two) hours as needed for migraine (no more than 4 per day). 20 tablet 4   No current facility-administered medications for this visit.   Allergies  Allergen Reactions  . Ciprofloxacin Nausea And Vomiting

## 2020-04-24 NOTE — Patient Instructions (Signed)
Thank you for coming in today.  Please call [facility] at [phone] to schedule your spine injection.   Keep me updated.  If this 2nd shot does not work we may consider surgery evaluation.

## 2020-05-05 ENCOUNTER — Other Ambulatory Visit: Payer: Managed Care, Other (non HMO)

## 2020-05-06 ENCOUNTER — Other Ambulatory Visit: Payer: Managed Care, Other (non HMO)

## 2020-05-06 NOTE — Progress Notes (Signed)
[name] [address] [place] [address] Phone: [phone] Subjective:   [name], am serving as a scribe for Dr. Antoine Primas. This visit occurred during the SARS-CoV-2 public health emergency.  Safety protocols were in place, including screening questions prior to the visit, additional usage of staff PPE, and extensive cleaning of exam room while observing appropriate contact time as indicated for disinfecting solutions.   I'm seeing this patient by the request  of:  Laurann Montana, MD  CC: Low back pain  HKV:[id number]   06/27/2019 Mild to moderate patellofemoral syndrome.  Discussed which activities to do which wants to avoid.  Discussed icing regimen and home exercises.  Follow-up again in 4 to 8 weeks  Right shoulder bursitis.  Discussed the possibility of injection which patient declined.  Discussed icing regimen and home exercises.  We will consider further work-up for this.  Follow-up again in 4 to 6 weeks  Update 05/07/2020 Brianna Weeks is a 54 y.o. female coming in with complaint of right shoulder and knee pain. Nerve root 04/16/2020. Patient states that epidural on 04/16/2020 did help but is still having soreness in right quad. Was scheduled for another epidural yesterday but wanted to speak with [place] before getting a second shot.  Patient states that the back pain is improved but continues to have more of a quad pain.  Denies any groin pain.  No difficulty with dysuria or abdominal pain.  Patient is wearing a back brace today which is helping to relieve her pain.   MRI of lumbar : IMPRESSION: 1. At L4-5 there is a broad-based disc bulge with small left foraminal disc extrusion. Moderate bilateral facet arthropathy with bilateral facet effusions. 2. At L3-4 there is a mild broad-based disc bulge. Moderate bilateral facet arthropathy with bilateral facet effusions. Mild bilateral foraminal stenosis.    Past Medical History:    Diagnosis Date  . Anemia   . HIV infection (HCC)   . Thyroid disease    No past surgical history on file. Social History   Socioeconomic History  . Marital status: Unknown    Spouse name: Not on file  . Number of children: Not on file  . Years of education: Not on file  . Highest education level: Not on file  Occupational History  . Not on file  Tobacco Use  . Smoking status: Never Smoker  . Smokeless tobacco: Never Used  Substance and Sexual Activity  . Alcohol use: No    Alcohol/week: 0.0 standard drinks  . Drug use: No  . Sexual activity: Not Currently    Partners: Male    Comment: pt. given condoms  Other Topics Concern  . Not on file  Social History Narrative   ** Merged History Encounter **       Social Determinants of Health   Financial Resource Strain:   . Difficulty of Paying Living Expenses: Not on file  Food Insecurity:   . Worried About Programme researcher, broadcasting/film/video in the Last Year: Not on file  . Ran Out of Food in the Last Year: Not on file  Transportation Needs:   . Lack of Transportation (Medical): Not on file  . Lack of Transportation (Non-Medical): Not on file  Physical Activity:   . Days of Exercise per Week: Not on file  . Minutes of Exercise per Session: Not on file  Stress:   . Feeling of Stress : Not on file  Social Connections:   .  Frequency of Communication with Friends and Family: Not on file  . Frequency of Social Gatherings with Friends and Family: Not on file  . Attends Religious Services: Not on file  . Active Member of Clubs or Organizations: Not on file  . Attends Banker Meetings: Not on file  . Marital Status: Not on file   Allergies  Allergen Reactions  . Ciprofloxacin Nausea And Vomiting   Family History  Problem Relation Age of Onset  . Hypertension Mother     Current Outpatient Medications (Endocrine & Metabolic):  .  levothyroxine (SYNTHROID) 175 MCG tablet, levothyroxine sodium 175 mcg tabs  Current  Outpatient Medications (Cardiovascular):  .  nitroGLYCERIN (NITRODUR - DOSED IN MG/24 HR) 0.2 mg/hr patch, 1/4 patch daily  Current Outpatient Medications (Respiratory):  .  fexofenadine-pseudoephedrine (ALLEGRA-D) 60-120 MG per tablet, Take 1 tablet by mouth 2 (two) times daily.  Current Outpatient Medications (Analgesics):  .  butalbital-acetaminophen-caffeine (FIORICET, ESGIC) 50-325-40 MG per tablet, Take 1 tablet by mouth 2 (two) times daily as needed for headache. .  meloxicam (MOBIC) 15 MG tablet, Take 15 mg by mouth daily. Reported on 10/28/2015 .  naproxen (NAPROSYN) 500 MG tablet, Take 1 tablet (500 mg total) by mouth 2 (two) times daily with a meal. .  SUMAtriptan (IMITREX) 25 MG tablet, Take 1 tablet (25 mg total) by mouth every 2 (two) hours as needed for migraine (no more than 4 per day).   Current Outpatient Medications (Other):  .  amitriptyline (ELAVIL) 25 MG tablet, Take 1-2 tablets (25-50 mg total) by mouth at bedtime. For pain and sleep .  [name] 50-25 MG TABS, TAKE 1 TABLET BY MOUTH EVERY DAY WITH BREAKFAST .  LORazepam (ATIVAN) 0.5 MG tablet, 1-2 tabs 30 - 60 min prior to MRI. Do not drive with this medicine.   Reviewed prior external information including notes and imaging from  primary care provider As well as notes that were available from care everywhere and other healthcare systems.  Past medical history, social, surgical and family history all reviewed in electronic medical record.  No pertanent information unless stated regarding to the chief complaint.   Review of Systems:  No headache, visual changes, nausea, vomiting, diarrhea, constipation, dizziness, abdominal pain, skin rash, fevers, chills, night sweats, weight loss, swollen lymph nodes, body aches, joint swelling, chest pain, shortness of breath, mood changes. POSITIVE muscle aches  Objective  Blood pressure 110/86, pulse 71, height 5' 8.5" (1.74 m), weight 178 lb (80.7 kg), last menstrual period  05/04/2014, SpO2 99 %.   General: No apparent distress alert and oriented x3 mood and affect normal, dressed appropriately.  HEENT: Pupils equal, extraocular movements intact  Respiratory: Patient's speak in full sentences and does not appear short of breath  Cardiovascular: No lower extremity edema, non tender, no erythema  Neuro: Cranial nerves II through XII are intact, neurovascularly intact in all extremities with 2+ DTRs and 2+ pulses.  Gait normal with good balance and coordination.  MSK:   Low back exam shows the patient is tender to palpation in the paraspinal musculature on the right side.  Mild positive radicular symptoms with the right leg but very minimal.  Mild positive FABER test on the right leg as well.  Patient has 4+ out of 5 strength but fairly symmetric to the contralateral side.  Deep tendon reflexes are intact    Impression and Recommendations:     The above documentation has been reviewed and is accurate and complete Clifton Custard  Tamala Julian, DO

## 2020-05-07 ENCOUNTER — Other Ambulatory Visit: Payer: Self-pay

## 2020-05-07 ENCOUNTER — Encounter: Payer: Self-pay | Admitting: Family Medicine

## 2020-05-07 ENCOUNTER — Ambulatory Visit (INDEPENDENT_AMBULATORY_CARE_PROVIDER_SITE_OTHER): Payer: Managed Care, Other (non HMO) | Admitting: Family Medicine

## 2020-05-07 VITALS — BP 110/86 | HR 71 | Ht 68.5 in | Wt 178.0 lb

## 2020-05-07 DIAGNOSIS — M5416 Radiculopathy, lumbar region: Secondary | ICD-10-CM | POA: Insufficient documentation

## 2020-05-07 NOTE — Patient Instructions (Signed)
CoQ10 200mg  daily PT [facility] Hold on epidural See me in 4-5 weeks for OMT

## 2020-05-07 NOTE — Assessment & Plan Note (Signed)
Lumbar radiculopathy.  I do believe that this is contributing to some of the quad pain.  The differential also includes a myositis.  Patient is going to do more over-the-counter medicines, does not want to take as much as a prescription medication she has taken over the course of time but would like to start with formal physical therapy.  Did respond well to the initial epidural and we can repeat as well.  Patient will follow up in 4 to 5 weeks and at that time we will consider manipulation as well.

## 2020-05-12 ENCOUNTER — Other Ambulatory Visit: Payer: Self-pay

## 2020-05-12 ENCOUNTER — Ambulatory Visit: Payer: Managed Care, Other (non HMO) | Attending: Family Medicine

## 2020-05-12 DIAGNOSIS — M5441 Lumbago with sciatica, right side: Secondary | ICD-10-CM | POA: Diagnosis present

## 2020-05-12 DIAGNOSIS — G8929 Other chronic pain: Secondary | ICD-10-CM | POA: Diagnosis present

## 2020-05-12 DIAGNOSIS — M6281 Muscle weakness (generalized): Secondary | ICD-10-CM | POA: Diagnosis present

## 2020-05-12 DIAGNOSIS — R252 Cramp and spasm: Secondary | ICD-10-CM | POA: Insufficient documentation

## 2020-05-12 NOTE — Therapy (Signed)
[facility] [address], [address] [place], [place], [address] Phone: [phone]   Fax:  [phone]  Physical Therapy Treatment  Patient Details  Name: Brianna Weeks MRN: [id number] Date of Birth: 12-09-1965 Referring Provider (PT): Antoine Primas, MD   Encounter Date: 05/12/2020   PT End of Session - 05/12/20 0930    Visit Number 1    Date for PT Re-Evaluation 07/07/20    Authorization Type [employer]    PT Start Time 0849    PT Stop Time 0929    PT Time Calculation (min) 40 min    Activity Tolerance Patient tolerated treatment well    Behavior During Therapy [facility] for tasks assessed/performed           Past Medical History:  Diagnosis Date   Anemia    HIV infection (HCC)    Thyroid disease     History reviewed. No pertinent surgical history.  There were no vitals filed for this visit.   Subjective Assessment - 05/12/20 0859    Subjective Pt presents to PT with complaints of Rt LE pain and Rt sided LBP that began 6 weeks ago without cause.  Pt had injection 3 weeks ago and this helped.    Pertinent History HIV    How long can you walk comfortably? haven't done this much    Diagnostic tests MRI: L3-4 and L4-5 broad base    Patient Stated Goals return to exercise, reduce morning pain    Currently in Pain? Yes    Pain Score 3    up to 6-7/10   Pain Location Back    Pain Orientation Right    Pain Descriptors / Indicators Aching    Pain Type Chronic pain    Pain Radiating Towards Rt quad    Pain Onset More than a month ago    Pain Frequency Intermittent    Aggravating Factors  early morning hours    Pain Relieving Factors few hours after waking              [facility] PT Assessment - 05/12/20 0001      Assessment   Medical Diagnosis lumbar radiculopathy    Referring Provider (PT) Antoine Primas, MD    Onset Date/Surgical Date 03/31/20    Next MD Visit 06/12/20    Prior Therapy none       Precautions    Precautions None      Restrictions   Weight Bearing Restrictions No      Balance Screen   Has the patient fallen in the past 6 months No    Has the patient had a decrease in activity level because of a fear of falling?  No    Is the patient reluctant to leave their home because of a fear of falling?  No      Home Environment   Living Environment Private residence    Type of Home House      Prior Function   Level of Independence Independent    Vocation Full time employment    Vocation Requirements desk work     Leisure exercise at gym, running,      Cognition   Overall Cognitive Status Within Functional Limits for tasks assessed      Observation/Other Assessments   Focus on Therapeutic Outcomes (FOTO)  56% limitation      Posture/Postural Control   Posture/Postural Control No significant limitations    Posture Comments Pt wears an abdominal binder brace during  the day      ROM / Strength   AROM / PROM / Strength AROM;PROM;Strength      AROM   Overall AROM  Deficits    Overall AROM Comments full lumbar flexion with hamstring tension.  Rt sidebending and extension limited by 50% with reproduction of Rt LE pain.  Lt sidebending is full with relief of Rt LE pain.        PROM   Overall PROM  Within functional limits for tasks performed    Overall PROM Comments Rt buttock pain with end range Rt hip P/ROM      Strength   Overall Strength Deficits    Overall Strength Comments Lt hip/knee/ankle is 5/5    Strength Assessment Site Knee;Hip;Ankle    Right/Left Hip Right    Right Hip Flexion 4-/5    Right Hip Extension 3+/5    Right Hip ABduction 3+/5    Right/Left Knee Right;Left    Right Knee Flexion 3+/5    Right Knee Extension 4-/5    Left Knee Flexion 5/5    Left Knee Extension 5/5    Right/Left Ankle Right    Right Ankle Dorsiflexion 4/5      Palpation   Spinal mobility Reduced PA mobility by 25% in lumbar segments without increased pain.      Palpation comment  tension and significant tenderness over Rt>Lt lumbar paraspinals, quadratus and gluteals with trigger points       Special Tests    Special Tests Lumbar    Lumbar Tests Slump Test;Straight Leg Raise      Slump test   Findings Positive    Side Right      Straight Leg Raise   Findings Negative    Side  Right      Transfers   Transfers Independent with all Transfers      Ambulation/Gait   Ambulation/Gait Yes    Gait Pattern Within Functional Limits                                 PT Education - 05/12/20 0928    Education Details Access Code: [email or web address]    Person(s) Educated Patient    Methods Explanation;Demonstration;Handout    Comprehension Verbalized understanding;Returned demonstration            PT Short Term Goals - 05/12/20 0903      PT SHORT TERM GOAL #1   Title be independent in initial HEP    Time 4    Period Weeks    Status New    Target Date 06/09/20      PT SHORT TERM GOAL #2   Title report a 30% reduction in the frequency and intensity of Rt LBP and Rt LE pain in the mornings    Time 4    Period Weeks    Status New    Target Date 06/09/20      PT SHORT TERM GOAL #3   Title initiate return to regular exercise routine and verbalize safe progression    Time 4    Period Weeks    Status New    Target Date 06/09/20             PT Long Term Goals - 05/12/20 0910      PT LONG TERM GOAL #1   Title be independent in advanced HEP    Time 8    Period Weeks  Status New    Target Date 07/07/20      PT LONG TERM GOAL #2   Title report a 70% reduction in Rt sided LBP and Rt LE pain in the early morning hours    Time 8    Period Weeks    Status New    Target Date 07/07/20      PT LONG TERM GOAL #3   Title reduce FOTO to < or = to 34% limitation    Time 8    Period Weeks    Status New    Target Date 07/07/20      PT LONG TERM GOAL #4   Title demonstrate 4+/5 Rt hip and knee strength to improve functional endurance  and improve safety with community function    Time 8    Period Weeks    Status New    Target Date 07/07/20      PT LONG TERM GOAL #5   Title return to regular strength and endurance exercise program for improvement of functional endurance required for community function    Time 8    Period Weeks    Status New    Target Date 07/07/20                 Plan - 05/12/20 1012    Clinical Impression Statement Pt presents to PT with lumbar radiculopathy on the Rt that began 6 weeks ago without incident or injury.  Recent MRI showed a broad based disc bulge at L3-4 and L4-5 and facet arthropathy and pt had a round of epidural injections at MD office.   This helped and pt would like to do PT to address the underlying cause of her pain.  Pt reports 3/10 Rt sided LBP and Rt quad pain today and up to 6-7/10 pain mostly present in the first few hours of the morning.  Pt has stopped her regular exercise that usually includes, indoor biking, weight training, and running/walking outdoors.  Pt with limited lumbar A/ROM into extension and Rt sidebending with reproduction of Rt LE symptoms.  Lt lateral flexion relieves Rt radiculopathy.  Pt with reduced Rt LE strength throughout and reports pain with Rt LE resisted testing and movement.  Pt with significant palpable tenderness over Rt lumbar paraspinals and quadratus and gluteals with active trigger points.  Pt will benefit from skilled PT to address Rt LE radiculopathy, reduced strength, trigger points and limited endurance for functional tasks.    Personal Factors and Comorbidities Comorbidity 1    Comorbidities HNP at L3-L5    Examination-Activity Limitations Lift;Stand;Stairs;Squat;Locomotion Level    Examination-Participation Restrictions Cleaning;Community Activity    Stability/Clinical Decision Making Stable/Uncomplicated    Clinical Decision Making Low    Rehab Potential Good    PT Frequency 2x / week    PT Duration 8 weeks    PT  Treatment/Interventions ADLs/Self Care Home Management;Cryotherapy;Electrical Stimulation;Traction;Moist Heat;Gait training;Stair training;Functional mobility training;Therapeutic activities;Therapeutic exercise;Neuromuscular re-education;Manual techniques;Patient/family education;Passive range of motion;Dry needling;Joint Manipulations;Spinal Manipulations;Taping    PT Next Visit Plan Rt LE strength exercises in pain free range, flexibility, nerve flossing if tolerated, manual to address Rt musculoskeletal tension    PT Home Exercise Plan Access Code: [email or web address]    Consulted and Agree with Plan of Care Patient           Patient will benefit from skilled therapeutic intervention in order to improve the following deficits and impairments:  Decreased activity tolerance, Decreased strength, Impaired flexibility, Pain, Decreased endurance, Decreased range  of motion  Visit Diagnosis: Chronic bilateral low back pain with right-sided sciatica - Plan: PT plan of care cert/re-cert  Cramp and spasm - Plan: PT plan of care cert/re-cert  Muscle weakness (generalized) - Plan: PT plan of care cert/re-cert     Problem List Patient Active Problem List   Diagnosis Date Noted   Lumbar radiculopathy 05/07/2020   Acute bursitis of right shoulder 06/27/2019   Patellofemoral syndrome of left knee 06/27/2019   Lateral meniscus derangement, left 04/13/2017   Right rotator cuff tear 06/02/2016   Pterygium of left eye 05/06/2015   Unsp injury of left quadriceps musc/fasc/tend, init 02/26/2015   Hyperlipidemia 09/24/2014   Pure hypercholesterolemia 04/10/2013   Elevated hemoglobin A1c 04/10/2013   Hypothyroidism, postradioiodine therapy 10/31/2008   Headache(784.0) 03/12/2008   ANEMIA-NOS 05/04/2006   ALLERGIC RHINITIS 05/04/2006   Human immunodeficiency virus (HIV) disease (HCC) 12/07/2001     Lorrene Reid, PT 05/12/20 10:19 AM  [facility] [address], [address] [place], [place], [address] Phone: [phone]   Fax:  [phone]  Name: Brianna Weeks MRN: [id number] Date of Birth: April 15, 1966

## 2020-05-12 NOTE — Patient Instructions (Signed)
Access Code: [email or web address] URL: [email or web address] Date: 05/12/2020 Prepared by: Tresa Endo  Exercises Hooklying Single Knee to Chest Stretch - 2-3 x daily - 7 x weekly - 1 sets - 3 reps - 20-30 hold Supine Piriformis Stretch with Foot on Ground - 2-3 x daily - 7 x weekly - 1 sets - 3 reps - 20-30 hold Seated Transversus Abdominis Bracing - 1 x daily - 7 x weekly - 3 sets - 10 reps Seated Long Arc Quad - 3 x daily - 7 x weekly - 2 sets - 10 reps - 5 hold Seated March - 3 x daily - 7 x weekly - 2 sets - 10 reps

## 2020-05-16 ENCOUNTER — Other Ambulatory Visit: Payer: Self-pay | Admitting: Family Medicine

## 2020-05-19 ENCOUNTER — Ambulatory Visit: Payer: Managed Care, Other (non HMO) | Admitting: Physical Therapy

## 2020-05-19 ENCOUNTER — Other Ambulatory Visit: Payer: Self-pay

## 2020-05-19 DIAGNOSIS — G8929 Other chronic pain: Secondary | ICD-10-CM

## 2020-05-19 DIAGNOSIS — M5441 Lumbago with sciatica, right side: Secondary | ICD-10-CM

## 2020-05-19 DIAGNOSIS — R252 Cramp and spasm: Secondary | ICD-10-CM

## 2020-05-19 DIAGNOSIS — M6281 Muscle weakness (generalized): Secondary | ICD-10-CM

## 2020-05-19 NOTE — Therapy (Signed)
[facility] [address] [place], [place], [address] Phone: [phone]   Fax:  [phone]  Physical Therapy Treatment  Patient Details  Name: Brianna Weeks MRN: [id number] Date of Birth: Nov 19, 1965 Referring Provider (PT): Antoine Primas, MD   Encounter Date: 05/19/2020   PT End of Session - 05/19/20 0816    Visit Number 2    Date for PT Re-Evaluation 07/07/20    Authorization Type U[employer]    PT Start Time 0803    PT Stop Time 0844    PT Time Calculation (min) 41 min    Activity Tolerance Patient tolerated treatment well    Behavior During Therapy [facility] for tasks assessed/performed           Past Medical History:  Diagnosis Date  . Anemia   . HIV infection (HCC)   . Thyroid disease     No past surgical history on file.  There were no vitals filed for this visit.   Subjective Assessment - 05/19/20 0817    Subjective Pt states she has no pain but has discomfort this morning.    Pertinent History HIV    How long can you walk comfortably? haven't done this much    Diagnostic tests MRI: L3-4 and L4-5 broad base    Patient Stated Goals return to exercise, reduce morning pain    Currently in Pain? Yes    Pain Score 5     Pain Location Back    Pain Orientation Right    Pain Descriptors / Indicators Discomfort    Pain Type Chronic pain    Pain Radiating Towards Rt quad    Pain Onset More than a month ago    Pain Frequency Intermittent    Aggravating Factors  morning    Multiple Pain Sites No                             [facility] Adult PT Treatment/Exercise - 05/19/20 0001      Exercises   Exercises Lumbar      Lumbar Exercises: Stretches   Active Hamstring Stretch Right;2 reps   5 sec hold/rep on step - stopped dt pain   Single Knee to Chest Stretch Right;Left;2 reps;10 seconds    Hip Flexor Stretch Right;5 reps;Limitations   5 sec/rep     Lumbar Exercises: Machines for Strengthening     Leg Press seat 7: 70lb bilat 20x; 40 lb single 10x      Lumbar Exercises: Standing   Wall Slides 10 reps;3 seconds    Other Standing Lumbar Exercises hip abduction standing on Lt LE - 10 x      Lumbar Exercises: Supine   Clam 20 reps    Clam Limitations light green loop    Bent Knee Raise 20 reps;2 seconds    Large Ball Abdominal Isometric 10 reps    Large Ball Abdominal Isometric Limitations holding pball overhead reach and roll outs with LE                    PT Short Term Goals - 05/12/20 0903      PT SHORT TERM GOAL #1   Title be independent in initial HEP    Time 4    Period Weeks    Status New    Target Date 06/09/20      PT SHORT TERM GOAL #2   Title report a 30% reduction in  the frequency and intensity of Rt LBP and Rt LE pain in the mornings    Time 4    Period Weeks    Status New    Target Date 06/09/20      PT SHORT TERM GOAL #3   Title initiate return to regular exercise routine and verbalize safe progression    Time 4    Period Weeks    Status New    Target Date 06/09/20             PT Long Term Goals - 05/12/20 0910      PT LONG TERM GOAL #1   Title be independent in advanced HEP    Time 8    Period Weeks    Status New    Target Date 07/07/20      PT LONG TERM GOAL #2   Title report a 70% reduction in Rt sided LBP and Rt LE pain in the early morning hours    Time 8    Period Weeks    Status New    Target Date 07/07/20      PT LONG TERM GOAL #3   Title reduce FOTO to < or = to 34% limitation    Time 8    Period Weeks    Status New    Target Date 07/07/20      PT LONG TERM GOAL #4   Title demonstrate 4+/5 Rt hip and knee strength to improve functional endurance and improve safety with community function    Time 8    Period Weeks    Status New    Target Date 07/07/20      PT LONG TERM GOAL #5   Title return to regular strength and endurance exercise program for improvement of functional endurance required for community  function    Time 8    Period Weeks    Status New    Target Date 07/07/20                 Plan - 05/19/20 0910    Clinical Impression Statement Pt needed cues to keep lumbar spine neutral and did better with exercises in supine or with back supported on wall or leg press seat.  Today's session focused on isolating pain relieving movements and seems to respond well to hip flexor stretch on step.  Pt was monitored for pain throughout session and did not have one specific exercise that increased pain, but did feel more sore at the end of the session today.  Pt will benefit from skilled PT to continue to work on building up functional core strength as well as release of muscle tension at next session for focus on pain management.    PT Treatment/Interventions ADLs/Self Care Home Management;Cryotherapy;Electrical Stimulation;Traction;Moist Heat;Gait training;Stair training;Functional mobility training;Therapeutic activities;Therapeutic exercise;Neuromuscular re-education;Manual techniques;Patient/family education;Passive range of motion;Dry needling;Joint Manipulations;Spinal Manipulations;Taping    PT Next Visit Plan Rt musculoskeletal release for pain management maybe estim if needed; Rt LE and core strength; f/u on new exercises in HEP    PT Home Exercise Plan Access Code: [email or web address]    Consulted and Agree with Plan of Care Patient           Patient will benefit from skilled therapeutic intervention in order to improve the following deficits and impairments:  Decreased activity tolerance, Decreased strength, Impaired flexibility, Pain, Decreased endurance, Decreased range of motion  Visit Diagnosis: Chronic bilateral low back pain with right-sided sciatica  Cramp and spasm  Muscle  weakness (generalized)     Problem List Patient Active Problem List   Diagnosis Date Noted  . Lumbar radiculopathy 05/07/2020  . Acute bursitis of right shoulder 06/27/2019  . Patellofemoral syndrome  of left knee 06/27/2019  . Lateral meniscus derangement, left 04/13/2017  . Right rotator cuff tear 06/02/2016  . Pterygium of left eye 05/06/2015  . Unsp injury of left quadriceps musc/fasc/tend, init 02/26/2015  . Hyperlipidemia 09/24/2014  . Pure hypercholesterolemia 04/10/2013  . Elevated hemoglobin A1c 04/10/2013  . Hypothyroidism, postradioiodine therapy 10/31/2008  . Headache(784.0) 03/12/2008  . ANEMIA-NOS 05/04/2006  . ALLERGIC RHINITIS 05/04/2006  . Human immunodeficiency virus (HIV) disease (HCC) 12/07/2001    Junious Silk, PT 05/19/2020, 9:16 AM  [facility] [address] [place], [place], [address] Phone: [phone]   Fax:  [phone]  Name: Jorden Mahl MRN: [id number] Date of Birth: November 17, 1965

## 2020-05-21 ENCOUNTER — Ambulatory Visit: Payer: Managed Care, Other (non HMO) | Admitting: Family Medicine

## 2020-05-25 ENCOUNTER — Encounter: Payer: Self-pay | Admitting: Physical Therapy

## 2020-05-25 ENCOUNTER — Ambulatory Visit: Payer: Managed Care, Other (non HMO) | Attending: Family Medicine | Admitting: Physical Therapy

## 2020-05-25 ENCOUNTER — Other Ambulatory Visit: Payer: Self-pay

## 2020-05-25 DIAGNOSIS — M6281 Muscle weakness (generalized): Secondary | ICD-10-CM | POA: Diagnosis present

## 2020-05-25 DIAGNOSIS — M5441 Lumbago with sciatica, right side: Secondary | ICD-10-CM | POA: Diagnosis present

## 2020-05-25 DIAGNOSIS — R252 Cramp and spasm: Secondary | ICD-10-CM | POA: Diagnosis present

## 2020-05-25 DIAGNOSIS — G8929 Other chronic pain: Secondary | ICD-10-CM | POA: Diagnosis present

## 2020-05-25 NOTE — Therapy (Signed)
[facility] [address], [address] [place], [place], [address] Phone: [phone]   Fax:  [phone]  Physical Therapy Treatment  Patient Details  Name: Brianna Weeks MRN: [id number] Date of Birth: 1965-08-03 Referring Provider (PT): Antoine Primas, MD   Encounter Date: 05/25/2020   PT End of Session - 05/25/20 1013    Visit Number 3    Date for PT Re-Evaluation 07/07/20    Authorization Type UHC    PT Start Time 0804    PT Stop Time 0845    PT Time Calculation (min) 41 min    Activity Tolerance Patient tolerated treatment well    Behavior During Therapy [facility] for tasks assessed/performed           Past Medical History:  Diagnosis Date   Anemia    HIV infection (HCC)    Thyroid disease     History reviewed. No pertinent surgical history.  There were no vitals filed for this visit.   Subjective Assessment - 05/25/20 1014    Subjective Pt states she was sore after last session but it went away and felt better.  Pt states she is feeling the exercises are getting easier.  Still having discomfort and weakness through the Rt side and feels tension in muscles.    Pertinent History HIV    Patient Stated Goals return to exercise, reduce morning pain    Currently in Pain? Yes    Pain Score 5     Pain Location Back    Pain Orientation Right    Pain Descriptors / Indicators Discomfort    Pain Type Chronic pain    Pain Radiating Towards Rt lateral quad    Pain Onset More than a month ago    Pain Frequency Intermittent    Multiple Pain Sites No                             [facility] Adult PT Treatment/Exercise - 05/25/20 0001      Lumbar Exercises: Stretches   Active Hamstring Stretch Right;2 reps;20 seconds      Lumbar Exercises: Supine   Dead Bug 10 reps    Dead Bug Limitations LE only      Manual Therapy   Manual Therapy Soft tissue mobilization    Manual therapy comments Rt side only addressed     Soft tissue mobilization lumbar paraspinals, gluteals, hamstrings, quads,  hip flexor - all tight and TTP throughout                  PT Education - 05/25/20 0846    Education Details [email or web address]    Person(s) Educated Patient    Methods Explanation;Demonstration;Tactile cues;Verbal cues;Handout    Comprehension Verbalized understanding;Returned demonstration            PT Short Term Goals - 05/25/20 1026      PT SHORT TERM GOAL #1   Title be independent in initial HEP    Status Achieved      PT SHORT TERM GOAL #2   Title report a 30% reduction in the frequency and intensity of Rt LBP and Rt LE pain in the mornings    Status On-going      PT SHORT TERM GOAL #3   Title initiate return to regular exercise routine and verbalize safe progression    Status On-going             PT Long Term  Goals - 05/12/20 0910      PT LONG TERM GOAL #1   Title be independent in advanced HEP    Time 8    Period Weeks    Status New    Target Date 07/07/20      PT LONG TERM GOAL #2   Title report a 70% reduction in Rt sided LBP and Rt LE pain in the early morning hours    Time 8    Period Weeks    Status New    Target Date 07/07/20      PT LONG TERM GOAL #3   Title reduce FOTO to < or = to 34% limitation    Time 8    Period Weeks    Status New    Target Date 07/07/20      PT LONG TERM GOAL #4   Title demonstrate 4+/5 Rt hip and knee strength to improve functional endurance and improve safety with community function    Time 8    Period Weeks    Status New    Target Date 07/07/20      PT LONG TERM GOAL #5   Title return to regular strength and endurance exercise program for improvement of functional endurance required for community function    Time 8    Period Weeks    Status New    Target Date 07/07/20                 Plan - 05/25/20 1018    Clinical Impression Statement Today's session focused on more on releasing STM to Right side.  Pt has significant  muscle spasms Rt lumbar, gluteals, hamstrings, and lateral quads.  Pt reported feeling STM helped and felt better after treatment.  Pt was able to add more challenging exercises to the HEP and added a quad stretch today.  Pt will benefit from skilled PT to continue with POC.    PT Treatment/Interventions ADLs/Self Care Home Management;Cryotherapy;Electrical Stimulation;Traction;Moist Heat;Gait training;Stair training;Functional mobility training;Therapeutic activities;Therapeutic exercise;Neuromuscular re-education;Manual techniques;Patient/family education;Passive range of motion;Dry needling;Joint Manipulations;Spinal Manipulations;Taping    PT Next Visit Plan Rt musculoskeletal release for pain management maybe estim if needed; Rt LE and core strength; f/u on new exercises in HEP    PT Home Exercise Plan Access Code: [email or web address]    Consulted and Agree with Plan of Care Patient           Patient will benefit from skilled therapeutic intervention in order to improve the following deficits and impairments:  Decreased activity tolerance, Decreased strength, Impaired flexibility, Pain, Decreased endurance, Decreased range of motion  Visit Diagnosis: Chronic bilateral low back pain with right-sided sciatica  Cramp and spasm  Muscle weakness (generalized)     Problem List Patient Active Problem List   Diagnosis Date Noted   Lumbar radiculopathy 05/07/2020   Acute bursitis of right shoulder 06/27/2019   Patellofemoral syndrome of left knee 06/27/2019   Lateral meniscus derangement, left 04/13/2017   Right rotator cuff tear 06/02/2016   Pterygium of left eye 05/06/2015   Unsp injury of left quadriceps musc/fasc/tend, init 02/26/2015   Hyperlipidemia 09/24/2014   Pure hypercholesterolemia 04/10/2013   Elevated hemoglobin A1c 04/10/2013   Hypothyroidism, postradioiodine therapy 10/31/2008   Headache(784.0) 03/12/2008   ANEMIA-NOS 05/04/2006   ALLERGIC RHINITIS 05/04/2006    Human immunodeficiency virus (HIV) disease (HCC) 12/07/2001    Brayton Caves Cadel Stairs, PT 05/25/2020, 10:41 AM  [facility] [address] [place],  [place], [address] Phone: [phone]   Fax:  [phone]  Name: Varnell Donate MRN: [id number] Date of Birth: [birth year]

## 2020-05-25 NOTE — Patient Instructions (Signed)
Access Code: [email or web address] URL: [email or web address] Date: 05/25/2020 Prepared by: Dwana Curd  Exercises Hooklying Single Knee to Chest Stretch - 2-3 x daily - 7 x weekly - 1 sets - 3 reps - 20-30 hold Supine Piriformis Stretch with Foot on Ground - 2-3 x daily - 7 x weekly - 1 sets - 3 reps - 20-30 hold Seated Transversus Abdominis Bracing - 1 x daily - 7 x weekly - 3 sets - 10 reps Seated Long Arc Quad - 3 x daily - 7 x weekly - 2 sets - 10 reps - 5 hold Seated March - 3 x daily - 7 x weekly - 2 sets - 10 reps Wall Quarter Squat - 1 x daily - 7 x weekly - 3 sets - 10 reps - 3 sec hold Hooklying Small March - 1 x daily - 7 x weekly - 10 reps - 2 sets Standing Hip Flexor Stretch - 3 x daily - 7 x weekly - 2 reps - 1 sets - 30 sec hold Prone Quad Stretch with Towel Roll and Strap - 1 x daily - 7 x weekly - 3 sets - 10 reps Supine Transversus Abdominis Bracing with Heel Slide - 1 x daily - 7 x weekly - 10 reps - 2 sets

## 2020-06-11 ENCOUNTER — Ambulatory Visit (INDEPENDENT_AMBULATORY_CARE_PROVIDER_SITE_OTHER): Payer: Managed Care, Other (non HMO) | Admitting: Family Medicine

## 2020-06-11 ENCOUNTER — Other Ambulatory Visit: Payer: Self-pay

## 2020-06-11 ENCOUNTER — Encounter: Payer: Self-pay | Admitting: Family Medicine

## 2020-06-11 VITALS — BP 152/90 | HR 86 | Ht 68.0 in | Wt 175.0 lb

## 2020-06-11 DIAGNOSIS — M999 Biomechanical lesion, unspecified: Secondary | ICD-10-CM | POA: Diagnosis not present

## 2020-06-11 DIAGNOSIS — M5416 Radiculopathy, lumbar region: Secondary | ICD-10-CM | POA: Diagnosis not present

## 2020-06-11 NOTE — Progress Notes (Signed)
[name] [address] [place] [address] Phone: [phone] Subjective:   I Ronelle Nigh am serving as a Neurosurgeon for Dr. Antoine Primas.  This visit occurred during the SARS-CoV-2 public health emergency.  Safety protocols were in place, including screening questions prior to the visit, additional usage of staff PPE, and extensive cleaning of exam room while observing appropriate contact time as indicated for disinfecting solutions.   I'm seeing this patient by the request  of:  Laurann Montana, MD  CC: Low back follow-up  [id number]   05/07/2020 Lumbar radiculopathy.  I do believe that this is contributing to some of the quad pain.  The differential also includes a myositis.  Patient is going to do more over-the-counter medicines, does not want to take as much as a prescription medication she has taken over the course of time but would like to start with formal physical therapy.  Did respond well to the initial epidural and we can repeat as well.  Patient will follow up in 4 to 5 weeks and at that time we will consider manipulation as well.   Update 06/11/2020 Brianna Weeks is a 54 y.o. female coming in with complaint of lumbar radiculopathy. Patient states she is getting there. Believes she is making progress.  Patient states that she is approximately 30 to 50% better.  Patient has noticed that it is improving.  Has been to physical therapy 2 or 3 times.      MRI of lumbar : IMPRESSION: 1. At L4-5 there is a broad-based disc bulge with small left foraminal disc extrusion. Moderate bilateral facet arthropathy with bilateral facet effusions. 2. At L3-4 there is a mild broad-based disc bulge. Moderate bilateral facet arthropathy with bilateral facet effusions. Mild bilateral foraminal stenosis.   Past Medical History:  Diagnosis Date  . Anemia   . HIV infection (HCC)   . Thyroid disease    No past surgical history on file. Social History    Socioeconomic History  . Marital status: Unknown    Spouse name: Not on file  . Number of children: Not on file  . Years of education: Not on file  . Highest education level: Not on file  Occupational History  . Not on file  Tobacco Use  . Smoking status: Never Smoker  . Smokeless tobacco: Never Used  Substance and Sexual Activity  . Alcohol use: No    Alcohol/week: 0.0 standard drinks  . Drug use: No  . Sexual activity: Not Currently    Partners: Male    Comment: pt. given condoms  Other Topics Concern  . Not on file  Social History Narrative   ** Merged History Encounter **       Social Determinants of Health   Financial Resource Strain:   . Difficulty of Paying Living Expenses: Not on file  Food Insecurity:   . Worried About Programme researcher, broadcasting/film/video in the Last Year: Not on file  . Ran Out of Food in the Last Year: Not on file  Transportation Needs:   . Lack of Transportation (Medical): Not on file  . Lack of Transportation (Non-Medical): Not on file  Physical Activity:   . Days of Exercise per Week: Not on file  . Minutes of Exercise per Session: Not on file  Stress:   . Feeling of Stress : Not on file  Social Connections:   . Frequency of Communication with Friends and Family: Not on file  . Frequency of  Social Gatherings with Friends and Family: Not on file  . Attends Religious Services: Not on file  . Active Member of Clubs or Organizations: Not on file  . Attends Banker Meetings: Not on file  . Marital Status: Not on file   Allergies  Allergen Reactions  . Ciprofloxacin Nausea And Vomiting   Family History  Problem Relation Age of Onset  . Hypertension Mother     Current Outpatient Medications (Endocrine & Metabolic):  .  levothyroxine (SYNTHROID) 175 MCG tablet, levothyroxine sodium 175 mcg tabs  Current Outpatient Medications (Cardiovascular):  .  nitroGLYCERIN (NITRODUR - DOSED IN MG/24 HR) 0.2 mg/hr patch, 1/4 patch daily  Current  Outpatient Medications (Respiratory):  .  fexofenadine-pseudoephedrine (ALLEGRA-D) 60-120 MG per tablet, Take 1 tablet by mouth 2 (two) times daily.  Current Outpatient Medications (Analgesics):  .  butalbital-acetaminophen-caffeine (FIORICET, ESGIC) 50-325-40 MG per tablet, Take 1 tablet by mouth 2 (two) times daily as needed for headache. .  meloxicam (MOBIC) 15 MG tablet, Take 15 mg by mouth daily. Reported on 10/28/2015 .  naproxen (NAPROSYN) 500 MG tablet, Take 1 tablet (500 mg total) by mouth 2 (two) times daily with a meal. .  SUMAtriptan (IMITREX) 25 MG tablet, Take 1 tablet (25 mg total) by mouth every 2 (two) hours as needed for migraine (no more than 4 per day).   Current Outpatient Medications (Other):  .  amitriptyline (ELAVIL) 25 MG tablet, TAKE 1-2 TABLETS (25-50 MG TOTAL) BY MOUTH AT BEDTIME. FOR PAIN AND SLEEP .  [name] 50-25 MG TABS, TAKE 1 TABLET BY MOUTH EVERY DAY WITH BREAKFAST .  LORazepam (ATIVAN) 0.5 MG tablet, 1-2 tabs 30 - 60 min prior to MRI. Do not drive with this medicine.   Reviewed prior external information including notes and imaging from  primary care provider As well as notes that were available from care everywhere and other healthcare systems.  Past medical history, social, surgical and family history all reviewed in electronic medical record.  No pertanent information unless stated regarding to the chief complaint.   Review of Systems:  No headache, visual changes, nausea, vomiting, diarrhea, constipation, dizziness, abdominal pain, skin rash, fevers, chills, night sweats, weight loss, swollen lymph nodes, body aches, joint swelling, chest pain, shortness of breath, mood changes. POSITIVE muscle aches  Objective  Blood pressure (!) 152/90, pulse 86, height 5\' 8"  (1.727 m), weight 175 lb (79.4 kg), last menstrual period 05/04/2014, SpO2 98 %.   General: No apparent distress alert and oriented x3 mood and affect normal, dressed appropriately.  HEENT:  Pupils equal, extraocular movements intact  Respiratory: Patient's speak in full sentences and does not appear short of breath  Cardiovascular: No lower extremity edema, non tender, no erythema  Neuro: Cranial nerves II through XII are intact, neurovascularly intact in all extremities with 2+ DTRs and 2+ pulses.  Gait normal with good balance and coordination.  MSK:  Non tender with full range of motion and good stability and symmetric strength and tone of shoulders, elbows, wrist, hip, knee and ankles bilaterally.  Low back exam does show some very mild loss of lordosis.  Some mild increase in pain with extension of the back.  Tightness noted in the paraspinal musculature right greater than left patient straight leg test is improved from previous exam and does have 5 out of 5 strength.  Deep tendon reflexes do appear to be intact.  Osteopathic findings  T10 extended rotated and side bent left L3 flexed rotated and  side bent right Sacrum right on right    Impression and Recommendations:     The above documentation has been reviewed and is accurate and complete Judi Saa, DO

## 2020-06-11 NOTE — Patient Instructions (Signed)
Good to see you  I think PT will be good overall  Try manipulation today and hope it should do well  Will write you a note for another 6 weeks See me again in 6-7 weeks

## 2020-06-13 ENCOUNTER — Encounter: Payer: Self-pay | Admitting: Family Medicine

## 2020-06-13 DIAGNOSIS — M999 Biomechanical lesion, unspecified: Secondary | ICD-10-CM | POA: Insufficient documentation

## 2020-06-13 NOTE — Assessment & Plan Note (Signed)
Patient is making some improvement at this time.  Patient does have amitriptyline, and then she is to choose between the meloxicam and naproxen if she needs anything during the day.  Did respond fairly well to osteopathic manipulation today.  Discussed posture and ergonomics.  Patient will continue with the physical therapy.  Follow-up with me again in 6 to 8 weeks

## 2020-06-13 NOTE — Assessment & Plan Note (Signed)

## 2020-06-24 ENCOUNTER — Ambulatory Visit: Payer: Managed Care, Other (non HMO) | Attending: Family Medicine

## 2020-06-24 ENCOUNTER — Other Ambulatory Visit: Payer: Self-pay

## 2020-06-24 DIAGNOSIS — G8929 Other chronic pain: Secondary | ICD-10-CM | POA: Insufficient documentation

## 2020-06-24 DIAGNOSIS — R252 Cramp and spasm: Secondary | ICD-10-CM | POA: Insufficient documentation

## 2020-06-24 DIAGNOSIS — M6281 Muscle weakness (generalized): Secondary | ICD-10-CM | POA: Diagnosis present

## 2020-06-24 DIAGNOSIS — M5441 Lumbago with sciatica, right side: Secondary | ICD-10-CM | POA: Insufficient documentation

## 2020-06-24 NOTE — Therapy (Signed)
[facility] [address] [place], [place], [address] Phone: [phone]   Fax:  [phone]  Physical Therapy Treatment  Patient Details  Name: Marcia Hartwell MRN: [id number] Date of Birth: [birth year] Referring Provider (PT): Antoine Primas, MD   Encounter Date: 06/24/2020   PT End of Session - 06/24/20 1230    Visit Number 4    Date for PT Re-Evaluation 07/07/20    Authorization Type UHC    PT Start Time 1151    PT Stop Time 1229    PT Time Calculation (min) 38 min    Activity Tolerance Patient tolerated treatment well    Behavior During Therapy [facility] for tasks assessed/performed           Past Medical History:  Diagnosis Date  . Anemia   . HIV infection (HCC)   . Thyroid disease     History reviewed. No pertinent surgical history.  There were no vitals filed for this visit.   Subjective Assessment - 06/24/20 1153    Subjective I was doing OK and then the discomfort came back in my Rt leg.    Currently in Pain? Yes    Pain Score 3     Pain Location Leg    Pain Orientation Right    Pain Descriptors / Indicators Discomfort    Pain Type Chronic pain    Pain Onset More than a month ago    Pain Frequency Intermittent                             [facility] Adult PT Treatment/Exercise - 06/24/20 0001      Lumbar Exercises: Stretches   Active Hamstring Stretch Right;2 reps;20 seconds    Other Lumbar Stretch Exercise hip flexor stretch 3x10 second each      Lumbar Exercises: Aerobic   Nustep Level 2x 8 minutes- PT present to discuss progress      Lumbar Exercises: Seated   Sit to Stand 20 reps      Lumbar Exercises: Supine   Ab Set 20 reps    AB Set Limitations with heel slide    Dead Bug 10 reps    Dead Bug Limitations LE only    Straight Leg Raise 20 reps                    PT Short Term Goals - 05/25/20 1026      PT SHORT TERM GOAL #1   Title be independent in  initial HEP    Status Achieved      PT SHORT TERM GOAL #2   Title report a 30% reduction in the frequency and intensity of Rt LBP and Rt LE pain in the mornings    Status On-going      PT SHORT TERM GOAL #3   Title initiate return to regular exercise routine and verbalize safe progression    Status On-going             PT Long Term Goals - 06/24/20 1157      PT LONG TERM GOAL #1   Title be independent in advanced HEP    Time 8    Period Weeks    Status On-going      PT LONG TERM GOAL #2   Title report a 70% reduction in Rt sided LBP and Rt LE pain in the early morning hours    Baseline 60% overal  improvement    Time 8    Period Weeks    Status On-going      PT LONG TERM GOAL #5   Title return to regular strength and endurance exercise program for improvement of functional endurance required for community function    Status Achieved                 Plan - 06/24/20 1201    Clinical Impression Statement Pt reports that she feels 60% better since the start of care.  Pt has returned to her regular exercise routine but continues to perform fewer exercises that she did prior to onset of pain.  Pt reports improved functional strength of the Rt LE and doesn't notice weakness with endurance tasks.  Pt will continue to benefit from skilled PT to address Rt LE strength, mobility and pain as needed.    PT Frequency 2x / week    PT Duration 8 weeks    PT Treatment/Interventions ADLs/Self Care Home Management;Cryotherapy;Electrical Stimulation;Traction;Moist Heat;Gait training;Stair training;Functional mobility training;Therapeutic activities;Therapeutic exercise;Neuromuscular re-education;Manual techniques;Patient/family education;Passive range of motion;Dry needling;Joint Manipulations;Spinal Manipulations;Taping    PT Next Visit Plan Rt musculoskeletal release for pain management maybe estim if needed; Rt LE and core strength    PT Home Exercise Plan Access Code: [email or web address]     Recommended Other Services initial cert is signed           Patient will benefit from skilled therapeutic intervention in order to improve the following deficits and impairments:  Decreased activity tolerance, Decreased strength, Impaired flexibility, Pain, Decreased endurance, Decreased range of motion  Visit Diagnosis: Chronic bilateral low back pain with right-sided sciatica  Cramp and spasm  Muscle weakness (generalized)     Problem List Patient Active Problem List   Diagnosis Date Noted  . Nonallopathic lesion of sacral region 06/13/2020  . Lumbar radiculopathy 05/07/2020  . Acute bursitis of right shoulder 06/27/2019  . Patellofemoral syndrome of left knee 06/27/2019  . Lateral meniscus derangement, left 04/13/2017  . Right rotator cuff tear 06/02/2016  . Pterygium of left eye 05/06/2015  . Unsp injury of left quadriceps musc/fasc/tend, init 02/26/2015  . Hyperlipidemia 09/24/2014  . Pure hypercholesterolemia 04/10/2013  . Elevated hemoglobin A1c 04/10/2013  . Hypothyroidism, postradioiodine therapy 10/31/2008  . Headache(784.0) 03/12/2008  . ANEMIA-NOS 05/04/2006  . ALLERGIC RHINITIS 05/04/2006  . Human immunodeficiency virus (HIV) disease (HCC) 12/07/2001     Lorrene Reid, PT 06/24/20 12:31 PM  [facility] [address] [place], [place], [address] Phone: [phone]   Fax:  [phone]  Name: Iley Deignan MRN: [id number] Date of Birth: 04/04/1966

## 2020-06-26 ENCOUNTER — Encounter: Payer: Self-pay | Admitting: Physical Therapy

## 2020-06-26 ENCOUNTER — Other Ambulatory Visit: Payer: Self-pay

## 2020-06-26 ENCOUNTER — Ambulatory Visit: Payer: Managed Care, Other (non HMO) | Admitting: Physical Therapy

## 2020-06-26 DIAGNOSIS — G8929 Other chronic pain: Secondary | ICD-10-CM

## 2020-06-26 DIAGNOSIS — M6281 Muscle weakness (generalized): Secondary | ICD-10-CM

## 2020-06-26 DIAGNOSIS — M5441 Lumbago with sciatica, right side: Secondary | ICD-10-CM | POA: Diagnosis not present

## 2020-06-26 DIAGNOSIS — R252 Cramp and spasm: Secondary | ICD-10-CM

## 2020-06-26 NOTE — Therapy (Signed)
[facility] [address], [address] [place], [place], [address] Phone: [phone]   Fax:  [phone]  Physical Therapy Treatment  Patient Details  Name: Brianna Weeks MRN: [id number] Date of Birth: 03/11/1966 Referring Provider (PT): Antoine Primas, MD   Encounter Date: 06/26/2020   PT End of Session - 06/26/20 0812    Visit Number 5    Date for PT Re-Evaluation 07/07/20    Authorization Type UHC    PT Start Time 0805    PT Stop Time 0843    PT Time Calculation (min) 38 min    Activity Tolerance Patient tolerated treatment well    Behavior During Therapy [facility] for tasks assessed/performed           Past Medical History:  Diagnosis Date  . Anemia   . HIV infection (HCC)   . Thyroid disease     History reviewed. No pertinent surgical history.  There were no vitals filed for this visit.   Subjective Assessment - 06/26/20 0808    Subjective I feel good just a little in the Rt thigh, but whatever we did last time helped a lot.    Patient Stated Goals return to exercise, reduce morning pain    Currently in Pain? Yes    Pain Score 4     Pain Location Leg    Pain Orientation Right    Pain Descriptors / Indicators Discomfort    Pain Type Chronic pain    Pain Radiating Towards Rt quad    Pain Onset More than a month ago    Multiple Pain Sites No              [facility] PT Assessment - 06/26/20 0001      Strength   Right Hip Flexion 4+/5    Right Hip Extension 4+/5    Right Hip ABduction 4/5    Right Knee Flexion 4/5    Right Knee Extension 5/5    Right Ankle Dorsiflexion 5/5                         [facility] Adult PT Treatment/Exercise - 06/26/20 0001      Lumbar Exercises: Stretches   Active Hamstring Stretch Right;2 reps;20 seconds    Figure 4 Stretch 2 reps;20 seconds;Seated;With overpressure    Other Lumbar Stretch Exercise hip flexor stretch 3x10 second each      Lumbar Exercises: Aerobic    Nustep Level 3x 8 minutes- PT present to discuss progress      Lumbar Exercises: Standing   Other Standing Lumbar Exercises hip abduction and knee flexion - 2lb - 2x10      Lumbar Exercises: Seated   Sit to Stand 20 reps      Lumbar Exercises: Supine   Ab Set 20 reps    AB Set Limitations with heel slide    Dead Bug 20 reps    Dead Bug Limitations LE march and holding pball    Straight Leg Raise 10 reps      Lumbar Exercises: Sidelying   Hip Abduction 10 reps                    PT Short Term Goals - 06/26/20 8527      PT SHORT TERM GOAL #2   Title report a 30% reduction in the frequency and intensity of Rt LBP and Rt LE pain in the mornings    Baseline 60% better  Status Achieved      PT SHORT TERM GOAL #3   Title initiate return to regular exercise routine and verbalize safe progression    Status Achieved             PT Long Term Goals - 06/26/20 0811      PT LONG TERM GOAL #1   Title be independent in advanced HEP    Status On-going      PT LONG TERM GOAL #2   Title report a 70% reduction in Rt sided LBP and Rt LE pain in the early morning hours    Baseline 60% overal improvement    Status On-going      PT LONG TERM GOAL #4   Title demonstrate 4+/5 Rt hip and knee strength to improve functional endurance and improve safety with community function    Baseline hip abduction 4/5    Status On-going      PT LONG TERM GOAL #5   Title return to regular strength and endurance exercise program for improvement of functional endurance required for community function    Status Achieved                 Plan - 06/26/20 0842    Clinical Impression Statement Pt demonstrates much improvement of strength throughout the LE.  Pt did well with exercise progressions today.  Pt will benefit from skilled PT to continue to work on strength for improved funcitonal activities.    PT Treatment/Interventions ADLs/Self Care Home Management;Cryotherapy;Electrical  Stimulation;Traction;Moist Heat;Gait training;Stair training;Functional mobility training;Therapeutic activities;Therapeutic exercise;Neuromuscular re-education;Manual techniques;Patient/family education;Passive range of motion;Dry needling;Joint Manipulations;Spinal Manipulations;Taping    PT Next Visit Plan Rt LE strength, core strength, hip abduction, progress to mini squats    PT Home Exercise Plan Access Code: [email or web address]    Consulted and Agree with Plan of Care Patient           Patient will benefit from skilled therapeutic intervention in order to improve the following deficits and impairments:  Decreased activity tolerance, Decreased strength, Impaired flexibility, Pain, Decreased endurance, Decreased range of motion  Visit Diagnosis: Chronic bilateral low back pain with right-sided sciatica  Cramp and spasm  Muscle weakness (generalized)     Problem List Patient Active Problem List   Diagnosis Date Noted  . Nonallopathic lesion of sacral region 06/13/2020  . Lumbar radiculopathy 05/07/2020  . Acute bursitis of right shoulder 06/27/2019  . Patellofemoral syndrome of left knee 06/27/2019  . Lateral meniscus derangement, left 04/13/2017  . Right rotator cuff tear 06/02/2016  . Pterygium of left eye 05/06/2015  . Unsp injury of left quadriceps musc/fasc/tend, init 02/26/2015  . Hyperlipidemia 09/24/2014  . Pure hypercholesterolemia 04/10/2013  . Elevated hemoglobin A1c 04/10/2013  . Hypothyroidism, postradioiodine therapy 10/31/2008  . Headache(784.0) 03/12/2008  . ANEMIA-NOS 05/04/2006  . ALLERGIC RHINITIS 05/04/2006  . Human immunodeficiency virus (HIV) disease (HCC) 12/07/2001    Junious Silk, PT 06/26/2020, 8:47 AM  [facility] [address], [address] [place], [place], [address] Phone: [phone]   Fax:  [phone]  Name: Karry Barrilleaux MRN: [id number] Date of Birth: [birth year]

## 2020-06-30 ENCOUNTER — Encounter: Payer: Self-pay | Admitting: Physical Therapy

## 2020-06-30 ENCOUNTER — Other Ambulatory Visit: Payer: Self-pay

## 2020-06-30 ENCOUNTER — Ambulatory Visit: Payer: Managed Care, Other (non HMO) | Admitting: Physical Therapy

## 2020-06-30 DIAGNOSIS — R252 Cramp and spasm: Secondary | ICD-10-CM

## 2020-06-30 DIAGNOSIS — G8929 Other chronic pain: Secondary | ICD-10-CM

## 2020-06-30 DIAGNOSIS — M6281 Muscle weakness (generalized): Secondary | ICD-10-CM

## 2020-06-30 DIAGNOSIS — M5441 Lumbago with sciatica, right side: Secondary | ICD-10-CM | POA: Diagnosis not present

## 2020-06-30 NOTE — Therapy (Signed)
[facility] [address] [place], [place], [address] Phone: [phone]   Fax:  [phone]  Physical Therapy Treatment  Patient Details  Name: Brianna Weeks MRN: [id number] Date of Birth: 04-09-1966 Referring Provider (PT): Antoine Primas, MD   Encounter Date: 06/30/2020   PT End of Session - 06/30/20 0857    Visit Number 6    Date for PT Re-Evaluation 07/07/20    Authorization Type UHC    PT Start Time [phone]   arrived late   PT Stop Time 0930    PT Time Calculation (min) 38 min    Activity Tolerance Patient tolerated treatment well    Behavior During Therapy [facility] [place] [facility] for tasks assessed/performed           Past Medical History:  Diagnosis Date  . Anemia   . HIV infection (HCC)   . Thyroid disease     History reviewed. No pertinent surgical history.  There were no vitals filed for this visit.   Subjective Assessment - 06/30/20 0855    Subjective I am feeling good and just went to the gym. No pain this morning.    Patient Stated Goals return to exercise, reduce morning pain    Currently in Pain? No/denies                             [facility] Adult PT Treatment/Exercise - 06/30/20 0001      Lumbar Exercises: Stretches   Active Hamstring Stretch --    Figure 4 Stretch --    Other Lumbar Stretch Exercise hip flexor stretch 2 x 20 supine with PT overpressure      Lumbar Exercises: Aerobic   Nustep Level 3x 10 minutes- PT present to discuss progress      Lumbar Exercises: Machines for Strengthening   Leg Press seat 7: 70lb bilat 30x; single LE 35 lb  20x; 40lb 20x      Lumbar Exercises: Standing   Functional Squats 15 reps    Functional Squats Limitations mini squat     Wall Slides Limitations pelvic tilt at wall - discontinued due to unable to do with pelvic tilt    Other Standing Lumbar Exercises hip abduction and knee flexion - on foam mat - 2x10      Lumbar Exercises: Seated   Sit  to Stand 20 reps      Lumbar Exercises: Supine   Dead Bug 20 reps    Dead Bug Limitations LE march and alt UE add 1.5 ankle weight    Straight Leg Raise 20 reps   add 1.5 lb                   PT Short Term Goals - 06/26/20 6606      PT SHORT TERM GOAL #2   Title report a 30% reduction in the frequency and intensity of Rt LBP and Rt LE pain in the mornings    Baseline 60% better    Status Achieved      PT SHORT TERM GOAL #3   Title initiate return to regular exercise routine and verbalize safe progression    Status Achieved             PT Long Term Goals - 06/26/20 3016      PT LONG TERM GOAL #1   Title be independent in advanced HEP    Status On-going      PT  LONG TERM GOAL #2   Title report a 70% reduction in Rt sided LBP and Rt LE pain in the early morning hours    Baseline 60% overal improvement    Status On-going      PT LONG TERM GOAL #4   Title demonstrate 4+/5 Rt hip and knee strength to improve functional endurance and improve safety with community function    Baseline hip abduction 4/5    Status On-going      PT LONG TERM GOAL #5   Title return to regular strength and endurance exercise program for improvement of functional endurance required for community function    Status Achieved                 Plan - 06/30/20 0902    Clinical Impression Statement Pt did well with strengthening and able to progress exercise difficulty.  Pt needs cues to keep lumbar in more neutral position with less excessive lordosis and less anterior pelvic tilt.  Pt will benefit from skilled PT to continue core and LE strength with improved posture.    PT Treatment/Interventions ADLs/Self Care Home Management;Cryotherapy;Electrical Stimulation;Traction;Moist Heat;Gait training;Stair training;Functional mobility training;Therapeutic activities;Therapeutic exercise;Neuromuscular re-education;Manual techniques;Patient/family education;Passive range of motion;Dry  needling;Joint Manipulations;Spinal Manipulations;Taping    PT Next Visit Plan Rt LE strength, core strength, hip abduction, progress to mini squats, posture    PT Home Exercise Plan Access Code: [email or web address]    Consulted and Agree with Plan of Care Patient           Patient will benefit from skilled therapeutic intervention in order to improve the following deficits and impairments:  Decreased activity tolerance, Decreased strength, Impaired flexibility, Pain, Decreased endurance, Decreased range of motion  Visit Diagnosis: Chronic bilateral low back pain with right-sided sciatica  Cramp and spasm  Muscle weakness (generalized)     Problem List Patient Active Problem List   Diagnosis Date Noted  . Nonallopathic lesion of sacral region 06/13/2020  . Lumbar radiculopathy 05/07/2020  . Acute bursitis of right shoulder 06/27/2019  . Patellofemoral syndrome of left knee 06/27/2019  . Lateral meniscus derangement, left 04/13/2017  . Right rotator cuff tear 06/02/2016  . Pterygium of left eye 05/06/2015  . Unsp injury of left quadriceps musc/fasc/tend, init 02/26/2015  . Hyperlipidemia 09/24/2014  . Pure hypercholesterolemia 04/10/2013  . Elevated hemoglobin A1c 04/10/2013  . Hypothyroidism, postradioiodine therapy 10/31/2008  . Headache(784.0) 03/12/2008  . ANEMIA-NOS 05/04/2006  . ALLERGIC RHINITIS 05/04/2006  . Human immunodeficiency virus (HIV) disease (HCC) 12/07/2001    Junious Silk, PT 06/30/2020, 9:34 AM  [facility] [address] [place], [place], [address] Phone: [phone]   Fax:  [phone]  Name: Brianna Weeks MRN: [id number] Date of Birth: [birth year]

## 2020-07-02 ENCOUNTER — Encounter: Payer: Self-pay | Admitting: Physical Therapy

## 2020-07-02 ENCOUNTER — Ambulatory Visit: Payer: Managed Care, Other (non HMO) | Admitting: Physical Therapy

## 2020-07-02 ENCOUNTER — Other Ambulatory Visit: Payer: Self-pay

## 2020-07-02 DIAGNOSIS — M6281 Muscle weakness (generalized): Secondary | ICD-10-CM

## 2020-07-02 DIAGNOSIS — M5441 Lumbago with sciatica, right side: Secondary | ICD-10-CM | POA: Diagnosis not present

## 2020-07-02 DIAGNOSIS — R252 Cramp and spasm: Secondary | ICD-10-CM

## 2020-07-02 DIAGNOSIS — G8929 Other chronic pain: Secondary | ICD-10-CM

## 2020-07-02 NOTE — Therapy (Signed)
[facility] [address], [address] [place], [place], [address] Phone: [phone]   Fax:  [phone]  Physical Therapy Treatment  Patient Details  Name: Brianna Weeks MRN: [id number] Date of Birth: 11/11/1965 Referring Provider (PT): Antoine Primas, MD   Encounter Date: 07/02/2020   PT End of Session - 07/02/20 0805    Visit Number 7    Date for PT Re-Evaluation 07/07/20    Authorization Type UHC    PT Start Time 0801    PT Stop Time 0840    PT Time Calculation (min) 39 min    Activity Tolerance Patient tolerated treatment well    Behavior During Therapy [facility] for tasks assessed/performed           Past Medical History:  Diagnosis Date  . Anemia   . HIV infection (HCC)   . Thyroid disease     History reviewed. No pertinent surgical history.  There were no vitals filed for this visit.   Subjective Assessment - 07/02/20 0803    Subjective Pt states she is having a little pain on the Lt low back this morning.    Currently in Pain? Yes    Pain Score 4     Pain Location Back    Pain Orientation Left    Pain Descriptors / Indicators Discomfort    Pain Type Chronic pain    Pain Onset More than a month ago    Pain Frequency Intermittent    Multiple Pain Sites No                             [facility] Adult PT Treatment/Exercise - 07/02/20 0001      Lumbar Exercises: Stretches   Active Hamstring Stretch Right;2 reps;20 seconds    Hip Flexor Stretch Right;Left;3 reps;20 seconds    Figure 4 Stretch 2 reps;20 seconds;Seated;With overpressure    Other Lumbar Stretch Exercise trunk rotation - 10x      Lumbar Exercises: Aerobic   Nustep Level 3x 10 minutes- PT present to discuss progress      Lumbar Exercises: Standing   Other Standing Lumbar Exercises side step with light green loop - 2x across gym area      Lumbar Exercises: Seated   Sit to Stand 20 reps   holding 10lb kettle - cues to keep lumbar  spine lengthened     Lumbar Exercises: Supine   Bridge with [employer] Limitations bridge with weight shift and lower with alt LE - 10x each    Straight Leg Raise 20 reps   add 1.5 lb   Large Ball Abdominal Isometric Limitations ball roll out, ball passing UE/LE      Lumbar Exercises: Sidelying   Clam Limitations reverse clam 1.5# - 20x    Hip Abduction 10 reps                    PT Short Term Goals - 06/26/20 1324      PT SHORT TERM GOAL #2   Title report a 30% reduction in the frequency and intensity of Rt LBP and Rt LE pain in the mornings    Baseline 60% better    Status Achieved      PT SHORT TERM GOAL #3   Title initiate return to regular exercise routine and verbalize safe progression    Status Achieved             PT  Long Term Goals - 07/02/20 0806      PT LONG TERM GOAL #1   Title be independent in advanced HEP    Status On-going      PT LONG TERM GOAL #2   Title report a 70% reduction in Rt sided LBP and Rt LE pain in the early morning hours    Status On-going                 Plan - 07/02/20 0843    Clinical Impression Statement Today's session focused on pelvic stability and core strength.  Pt continues to need moderate cueing during all exercises and stretches for reducing excess lumbar lordosis.  Pt needed heavy tactile cues with lateral leg raise.  Pt overall, continues to gain strength and will benefit from skilled PT to work on core and posture awareness during functional movements    PT Treatment/Interventions ADLs/Self Care Home Management;Cryotherapy;Electrical Stimulation;Traction;Moist Heat;Gait training;Stair training;Functional mobility training;Therapeutic activities;Therapeutic exercise;Neuromuscular re-education;Manual techniques;Patient/family education;Passive range of motion;Dry needling;Joint Manipulations;Spinal Manipulations;Taping    PT Next Visit Plan re-eval, continue goblet squats and core strength during functional  movements add lunge, review HEP    PT Home Exercise Plan Access Code: [email or web address]    Consulted and Agree with Plan of Care Patient           Patient will benefit from skilled therapeutic intervention in order to improve the following deficits and impairments:  Decreased activity tolerance,Decreased strength,Impaired flexibility,Pain,Decreased endurance,Decreased range of motion  Visit Diagnosis: Chronic bilateral low back pain with right-sided sciatica  Cramp and spasm  Muscle weakness (generalized)     Problem List Patient Active Problem List   Diagnosis Date Noted  . Nonallopathic lesion of sacral region 06/13/2020  . Lumbar radiculopathy 05/07/2020  . Acute bursitis of right shoulder 06/27/2019  . Patellofemoral syndrome of left knee 06/27/2019  . Lateral meniscus derangement, left 04/13/2017  . Right rotator cuff tear 06/02/2016  . Pterygium of left eye 05/06/2015  . Unsp injury of left quadriceps musc/fasc/tend, init 02/26/2015  . Hyperlipidemia 09/24/2014  . Pure hypercholesterolemia 04/10/2013  . Elevated hemoglobin A1c 04/10/2013  . Hypothyroidism, postradioiodine therapy 10/31/2008  . Headache(784.0) 03/12/2008  . ANEMIA-NOS 05/04/2006  . ALLERGIC RHINITIS 05/04/2006  . Human immunodeficiency virus (HIV) disease (HCC) 12/07/2001    Junious Silk, PT 07/02/2020, 8:57 AM  [facility] [address] [place], [place], [address] Phone: [phone]   Fax:  [phone]  Name: Brianna Weeks MRN: [id number] Date of Birth: [birth year]

## 2020-07-07 ENCOUNTER — Other Ambulatory Visit: Payer: Self-pay

## 2020-07-07 ENCOUNTER — Ambulatory Visit: Payer: Managed Care, Other (non HMO) | Admitting: Physical Therapy

## 2020-07-07 ENCOUNTER — Encounter: Payer: Self-pay | Admitting: Physical Therapy

## 2020-07-07 DIAGNOSIS — M5441 Lumbago with sciatica, right side: Secondary | ICD-10-CM | POA: Diagnosis not present

## 2020-07-07 DIAGNOSIS — R252 Cramp and spasm: Secondary | ICD-10-CM

## 2020-07-07 DIAGNOSIS — G8929 Other chronic pain: Secondary | ICD-10-CM

## 2020-07-07 DIAGNOSIS — M6281 Muscle weakness (generalized): Secondary | ICD-10-CM

## 2020-07-07 NOTE — Therapy (Signed)
[facility] [address], [address] [place], [place], [address] Phone: [phone]   Fax:  [phone]  Physical Therapy Treatment  Patient Details  Name: Brianna Weeks MRN: [id number] Date of Birth: 25-Dec-1965 Referring Provider (PT): Antoine Primas, MD   Encounter Date: 07/07/2020   PT End of Session - 07/07/20 0809    Visit Number 8    Date for PT Re-Evaluation 08/04/20    PT Start Time 0800    PT Stop Time 0840    PT Time Calculation (min) 40 min    Activity Tolerance Patient tolerated treatment well    Behavior During Therapy [facility] for tasks assessed/performed           Past Medical History:  Diagnosis Date  . Anemia   . HIV infection (HCC)   . Thyroid disease     History reviewed. No pertinent surgical history.  There were no vitals filed for this visit.   Subjective Assessment - 07/07/20 0802    Subjective Pt is having some pain in the Rt back and down the leg because she has had to do a lot of driving lately.    Patient Stated Goals return to exercise, reduce morning pain    Currently in Pain? Yes    Pain Score 5     Pain Location Back    Pain Orientation Right    Pain Descriptors / Indicators Discomfort;Aching    Pain Type Chronic pain    Pain Radiating Towards Rt quad    Pain Onset More than a month ago    Pain Frequency Intermittent    Multiple Pain Sites No              [facility] PT Assessment - 07/07/20 0001      Assessment   Medical Diagnosis lumbar radiculopathy    Referring Provider (PT) Antoine Primas, MD      Observation/Other Assessments   Focus on Therapeutic Outcomes (FOTO)  44% limted                         [facility] Adult PT Treatment/Exercise - 07/07/20 0001      Lumbar Exercises: Stretches   Active Hamstring Stretch Right;20 seconds;3 reps    Hip Flexor Stretch Right;Left;3 reps;20 seconds   towel under thigh add quad stretch     Lumbar Exercises: Aerobic    Nustep Level 3x 10 minutes- PT present to discuss progress      Lumbar Exercises: Supine   Dead Bug 20 reps    Dead Bug Limitations LE march and alt UE add 2 lb ankle weight for LE, 3 lb free weight for UE    Straight Leg Raise 20 reps   add 2 lb     Manual Therapy   Manual therapy comments Rt side only addressed    Soft tissue mobilization gluteals, quads,  hip flexor - all tight and release palpated at trigger points with increased soft tissue length                    PT Short Term Goals - 06/26/20 3428      PT SHORT TERM GOAL #2   Title report a 30% reduction in the frequency and intensity of Rt LBP and Rt LE pain in the mornings    Baseline 60% better    Status Achieved      PT SHORT TERM GOAL #3   Title initiate return to  regular exercise routine and verbalize safe progression    Status Achieved             PT Long Term Goals - 07/07/20 0807      PT LONG TERM GOAL #1   Title be independent in advanced HEP    Status On-going      PT LONG TERM GOAL #2   Title report a 70% reduction in Rt sided LBP and Rt LE pain in the early morning hours    Baseline 60% overal improvement    Status On-going      PT LONG TERM GOAL #3   Title reduce FOTO to < or = to 34% limitation      PT LONG TERM GOAL #4   Title demonstrate 4+/5 Rt hip and knee strength to improve functional endurance and improve safety with community function    Baseline hip abduction 4/5    Status On-going      PT LONG TERM GOAL #5   Title return to regular strength and endurance exercise program for improvement of functional endurance required for community function    Status Achieved                 Plan - 07/07/20 0810    Clinical Impression Statement Pt is having a slight setback today due to an issue that came up with her daughter's car and she had to do a lot more driving that normal. Pt has made definite progress with goals as mentioned above and all goals currently up to date.   She is expected to continue to make progress and would benefit from skilled Pt for several more visits to ensure she is successful with her HEP and to achieve funcitonal goals and expected FOTO outcome.    Examination-Activity Limitations Lift;Stand;Stairs;Squat;Locomotion Level    Examination-Participation Restrictions Cleaning;Community Activity    Stability/Clinical Decision Making Stable/Uncomplicated    Rehab Potential Excellent    PT Frequency 2x / week    PT Duration 4 weeks    PT Treatment/Interventions ADLs/Self Care Home Management;Cryotherapy;Electrical Stimulation;Traction;Moist Heat;Gait training;Stair training;Functional mobility training;Therapeutic activities;Therapeutic exercise;Neuromuscular re-education;Manual techniques;Patient/family education;Passive range of motion;Dry needling;Joint Manipulations;Spinal Manipulations;Taping    PT Next Visit Plan goblet squats and progress core and hip abduciton strength, lunges    PT Home Exercise Plan Access Code: [email or web address]    Consulted and Agree with Plan of Care Patient           Patient will benefit from skilled therapeutic intervention in order to improve the following deficits and impairments:  Decreased activity tolerance,Decreased strength,Impaired flexibility,Pain,Decreased endurance,Decreased range of motion  Visit Diagnosis: Chronic bilateral low back pain with right-sided sciatica  Cramp and spasm  Muscle weakness (generalized)     Problem List Patient Active Problem List   Diagnosis Date Noted  . Nonallopathic lesion of sacral region 06/13/2020  . Lumbar radiculopathy 05/07/2020  . Acute bursitis of right shoulder 06/27/2019  . Patellofemoral syndrome of left knee 06/27/2019  . Lateral meniscus derangement, left 04/13/2017  . Right rotator cuff tear 06/02/2016  . Pterygium of left eye 05/06/2015  . Unsp injury of left quadriceps musc/fasc/tend, init 02/26/2015  . Hyperlipidemia 09/24/2014  . Pure  hypercholesterolemia 04/10/2013  . Elevated hemoglobin A1c 04/10/2013  . Hypothyroidism, postradioiodine therapy 10/31/2008  . Headache(784.0) 03/12/2008  . ANEMIA-NOS 05/04/2006  . ALLERGIC RHINITIS 05/04/2006  . Human immunodeficiency virus (HIV) disease (HCC) 12/07/2001    Sallyanne Havers L Serrena Linderman,PT 07/07/2020, 8:57 AM  [facility] [address], [address] [place], [place], [address] Phone: [phone]   Fax:  [phone]  Name: Fallon Haecker MRN: [id number] Date of Birth: [birth year]

## 2020-07-09 ENCOUNTER — Other Ambulatory Visit: Payer: Self-pay

## 2020-07-09 ENCOUNTER — Ambulatory Visit: Payer: Managed Care, Other (non HMO) | Admitting: Physical Therapy

## 2020-07-09 ENCOUNTER — Encounter: Payer: Self-pay | Admitting: Physical Therapy

## 2020-07-09 DIAGNOSIS — M5441 Lumbago with sciatica, right side: Secondary | ICD-10-CM

## 2020-07-09 DIAGNOSIS — M6281 Muscle weakness (generalized): Secondary | ICD-10-CM

## 2020-07-09 DIAGNOSIS — R252 Cramp and spasm: Secondary | ICD-10-CM

## 2020-07-09 NOTE — Therapy (Signed)
[facility] [address] [place], [place], [address] Phone: [phone]   Fax:  [phone]  Physical Therapy Treatment  Patient Details  Name: Brianna Weeks MRN: [id number] Date of Birth: Dec 16, 1965 Referring Provider (PT): Antoine Primas, MD   Encounter Date: 07/09/2020   PT End of Session - 07/09/20 0807    Visit Number 9    Date for PT Re-Evaluation 08/04/20    PT Start Time 0801    PT Stop Time 0840    PT Time Calculation (min) 39 min    Activity Tolerance Patient tolerated treatment well    Behavior During Therapy [facility] for tasks assessed/performed           Past Medical History:  Diagnosis Date  . Anemia   . HIV infection (HCC)   . Thyroid disease     History reviewed. No pertinent surgical history.  There were no vitals filed for this visit.   Subjective Assessment - 07/09/20 0804    Subjective Pt is feeling tired this morning due to a lot going on.  States the leg is better.    Currently in Pain? Yes    Pain Score 2     Pain Location Back    Pain Orientation Right    Pain Descriptors / Indicators Discomfort    Pain Type Chronic pain    Pain Radiating Towards Rt quad    Multiple Pain Sites No                             [facility] Adult PT Treatment/Exercise - 07/09/20 0001      Neuro Re-ed    Neuro Re-ed Details  technique and cues to keep pelvis and lumbar neutral      Lumbar Exercises: Stretches   Active Hamstring Stretch Right;20 seconds;3 reps    Hip Flexor Stretch Right;Left   10x each rolling ball back     Lumbar Exercises: Aerobic   Nustep Level 3x 10 minutes- PT present to discuss progress      Lumbar Exercises: Standing   Functional Squats 15 reps    Functional Squats Limitations mini squat - mod/max cues for straight back - hold 10#kettle bell    Forward Lunge 10 reps    Forward Lunge Limitations slider going backf or mini lunge    Other Standing Lumbar  Exercises side step with light green loop - 2x across gym area      Lumbar Exercises: Seated   Sit to Stand 10 reps    Sit to Stand Limitations with light green loop      Lumbar Exercises: Sidelying   Clam Limitations reverse clam 2# - 10x                    PT Short Term Goals - 06/26/20 0100      PT SHORT TERM GOAL #2   Title report a 30% reduction in the frequency and intensity of Rt LBP and Rt LE pain in the mornings    Baseline 60% better    Status Achieved      PT SHORT TERM GOAL #3   Title initiate return to regular exercise routine and verbalize safe progression    Status Achieved             PT Long Term Goals - 07/07/20 0807      PT LONG TERM GOAL #1   Title be independent  in advanced HEP    Status On-going      PT LONG TERM GOAL #2   Title report a 70% reduction in Rt sided LBP and Rt LE pain in the early morning hours    Baseline 60% overal improvement    Status On-going      PT LONG TERM GOAL #3   Title reduce FOTO to < or = to 34% limitation      PT LONG TERM GOAL #4   Title demonstrate 4+/5 Rt hip and knee strength to improve functional endurance and improve safety with community function    Baseline hip abduction 4/5    Status On-going      PT LONG TERM GOAL #5   Title return to regular strength and endurance exercise program for improvement of functional endurance required for community function    Status Achieved                 Plan - 07/09/20 1018    Clinical Impression Statement Pt did well with exercise and able to progress today.  Pt was feeling better than she did at previous visit, but still some pain due to being more busy than usal.  Pt needed a lot of cues to work on pelvic tilts and improved posture with functional squat and lunges. Pt will benefit from skilled PT to continue to work on strength and posture    PT Treatment/Interventions ADLs/Self Care Home Management;Cryotherapy;Electrical Stimulation;Traction;Moist  Heat;Gait training;Stair training;Functional mobility training;Therapeutic activities;Therapeutic exercise;Neuromuscular re-education;Manual techniques;Patient/family education;Passive range of motion;Dry needling;Joint Manipulations;Spinal Manipulations;Taping    PT Next Visit Plan progress goblet squats and progress core and hip abduciton strength, lunges    PT Home Exercise Plan Access Code: [email or web address]    Consulted and Agree with Plan of Care Patient           Patient will benefit from skilled therapeutic intervention in order to improve the following deficits and impairments:  Decreased activity tolerance,Decreased strength,Impaired flexibility,Pain,Decreased endurance,Decreased range of motion  Visit Diagnosis: Chronic bilateral low back pain with right-sided sciatica  Cramp and spasm  Muscle weakness (generalized)     Problem List Patient Active Problem List   Diagnosis Date Noted  . Nonallopathic lesion of sacral region 06/13/2020  . Lumbar radiculopathy 05/07/2020  . Acute bursitis of right shoulder 06/27/2019  . Patellofemoral syndrome of left knee 06/27/2019  . Lateral meniscus derangement, left 04/13/2017  . Right rotator cuff tear 06/02/2016  . Pterygium of left eye 05/06/2015  . Unsp injury of left quadriceps musc/fasc/tend, init 02/26/2015  . Hyperlipidemia 09/24/2014  . Pure hypercholesterolemia 04/10/2013  . Elevated hemoglobin A1c 04/10/2013  . Hypothyroidism, postradioiodine therapy 10/31/2008  . Headache(784.0) 03/12/2008  . ANEMIA-NOS 05/04/2006  . ALLERGIC RHINITIS 05/04/2006  . Human immunodeficiency virus (HIV) disease (HCC) 12/07/2001    Junious Silk, PT 07/09/2020, 10:20 AM  [facility] [address] [place], [place], [address] Phone: [phone]   Fax:  [phone]  Name: Brianna Weeks MRN: [id number] Date of Birth: 10-25-1965

## 2020-07-14 ENCOUNTER — Encounter: Payer: Self-pay | Admitting: Physical Therapy

## 2020-07-14 ENCOUNTER — Ambulatory Visit: Payer: Managed Care, Other (non HMO) | Admitting: Physical Therapy

## 2020-07-14 ENCOUNTER — Other Ambulatory Visit: Payer: Self-pay

## 2020-07-14 DIAGNOSIS — M5441 Lumbago with sciatica, right side: Secondary | ICD-10-CM | POA: Diagnosis not present

## 2020-07-14 DIAGNOSIS — R252 Cramp and spasm: Secondary | ICD-10-CM

## 2020-07-14 DIAGNOSIS — M6281 Muscle weakness (generalized): Secondary | ICD-10-CM

## 2020-07-14 NOTE — Therapy (Signed)
[facility] [address] [place], [place], [address] Phone: [phone]   Fax:  [phone]  Physical Therapy Treatment  Patient Details  Name: Brianna Weeks MRN: [id number] Date of Birth: 1965-11-20 Referring Provider (PT): Antoine Primas, MD   Encounter Date: 07/14/2020   PT End of Session - 07/14/20 0804    Visit Number 10    Date for PT Re-Evaluation 08/04/20    Authorization Type UHC    PT Start Time 0804    PT Stop Time 0842    PT Time Calculation (min) 38 min    Activity Tolerance Patient tolerated treatment well    Behavior During Therapy [facility] for tasks assessed/performed           Past Medical History:  Diagnosis Date  . Anemia   . HIV infection (HCC)   . Thyroid disease     History reviewed. No pertinent surgical history.  There were no vitals filed for this visit.   Subjective Assessment - 07/14/20 0806    Subjective Pt states she is feeling better.  Pt feels like she is 70% better, still has to sleep propped up, but getting strength back. Pt denies pain    Currently in Pain? No/denies                             [facility] Adult PT Treatment/Exercise - 07/14/20 0001      Lumbar Exercises: Stretches   Active Hamstring Stretch Right;20 seconds;3 reps    Hip Flexor Stretch Right;Left   10x each rolling ball back     Lumbar Exercises: Aerobic   Nustep Level 3x 10 minutes- PT present to discuss progress      Lumbar Exercises: Standing   Functional Squats 20 reps    Functional Squats Limitations mini squat - mod/max cues for straight back - hold 10#kettle bell    Forward Lunge 10 reps    Forward Lunge Limitations slider going backf or mini lunge    Shoulder Extension Strengthening;Power Tower;Both   30 reps   Shoulder Extension Limitations 25lb      Lumbar Exercises: Supine   Dead Bug Limitations LE march 2lb - 30x each side    Other Supine Lumbar Exercises supine foam  roller lengthways - alt UE; diagonals and horizontal abd red band - 30x each      Lumbar Exercises: Sidelying   Hip Abduction Both;15 reps             slider lunges fwd and side - 10x each       PT Short Term Goals - 06/26/20 6962      PT SHORT TERM GOAL #2   Title report a 30% reduction in the frequency and intensity of Rt LBP and Rt LE pain in the mornings    Baseline 60% better    Status Achieved      PT SHORT TERM GOAL #3   Title initiate return to regular exercise routine and verbalize safe progression    Status Achieved             PT Long Term Goals - 07/07/20 0807      PT LONG TERM GOAL #1   Title be independent in advanced HEP    Status On-going      PT LONG TERM GOAL #2   Title report a 70% reduction in Rt sided LBP and Rt LE pain in the early morning  hours    Baseline 60% overal improvement    Status On-going      PT LONG TERM GOAL #3   Title reduce FOTO to < or = to 34% limitation      PT LONG TERM GOAL #4   Title demonstrate 4+/5 Rt hip and knee strength to improve functional endurance and improve safety with community function    Baseline hip abduction 4/5    Status On-going      PT LONG TERM GOAL #5   Title return to regular strength and endurance exercise program for improvement of functional endurance required for community function    Status Achieved                 Plan - 07/14/20 0844    Clinical Impression Statement Pt had no increased pain today and was able to tolerate progression of exercises.  Pt did much better with not using excess lumbar and thoracic extension when squatting and lifting weights.  Pt was challenged with foam roller but able to keep it stable enough to perform the exercises.  Pt will benefit from skilled PT to progress core strength and posture.    PT Treatment/Interventions ADLs/Self Care Home Management;Cryotherapy;Electrical Stimulation;Traction;Moist Heat;Gait training;Stair training;Functional mobility  training;Therapeutic activities;Therapeutic exercise;Neuromuscular re-education;Manual techniques;Patient/family education;Passive range of motion;Dry needling;Joint Manipulations;Spinal Manipulations;Taping    PT Next Visit Plan progress foam roller, squats, lunges with cues for posture    PT Home Exercise Plan Access Code: [email or web address]    Consulted and Agree with Plan of Care Patient           Patient will benefit from skilled therapeutic intervention in order to improve the following deficits and impairments:  Decreased activity tolerance,Decreased strength,Impaired flexibility,Pain,Decreased endurance,Decreased range of motion  Visit Diagnosis: Chronic bilateral low back pain with right-sided sciatica  Cramp and spasm  Muscle weakness (generalized)     Problem List Patient Active Problem List   Diagnosis Date Noted  . Nonallopathic lesion of sacral region 06/13/2020  . Lumbar radiculopathy 05/07/2020  . Acute bursitis of right shoulder 06/27/2019  . Patellofemoral syndrome of left knee 06/27/2019  . Lateral meniscus derangement, left 04/13/2017  . Right rotator cuff tear 06/02/2016  . Pterygium of left eye 05/06/2015  . Unsp injury of left quadriceps musc/fasc/tend, init 02/26/2015  . Hyperlipidemia 09/24/2014  . Pure hypercholesterolemia 04/10/2013  . Elevated hemoglobin A1c 04/10/2013  . Hypothyroidism, postradioiodine therapy 10/31/2008  . Headache(784.0) 03/12/2008  . ANEMIA-NOS 05/04/2006  . ALLERGIC RHINITIS 05/04/2006  . Human immunodeficiency virus (HIV) disease (HCC) 12/07/2001    [name],PT 07/14/2020, 8:46 AM  [facility] [address], [address] [place], [place], [address] Phone: [phone]   Fax:  [phone]  Name: Brianna Weeks MRN: [id number] Date of Birth: 1965-09-13

## 2020-07-16 ENCOUNTER — Encounter: Payer: Self-pay | Admitting: Physical Therapy

## 2020-07-16 ENCOUNTER — Other Ambulatory Visit: Payer: Self-pay

## 2020-07-16 ENCOUNTER — Ambulatory Visit: Payer: Managed Care, Other (non HMO) | Admitting: Physical Therapy

## 2020-07-16 DIAGNOSIS — M6281 Muscle weakness (generalized): Secondary | ICD-10-CM

## 2020-07-16 DIAGNOSIS — R252 Cramp and spasm: Secondary | ICD-10-CM

## 2020-07-16 DIAGNOSIS — G8929 Other chronic pain: Secondary | ICD-10-CM

## 2020-07-16 DIAGNOSIS — M5441 Lumbago with sciatica, right side: Secondary | ICD-10-CM | POA: Diagnosis not present

## 2020-07-16 NOTE — Therapy (Signed)
[facility] [address], [address] [place], [place], [address] Phone: [phone]   Fax:  [phone]  Physical Therapy Treatment  Patient Details  Name: Brianna Weeks MRN: [id number] Date of Birth: 1966/03/22 Referring Provider (PT): Antoine Primas, MD   Encounter Date: 07/16/2020   PT End of Session - 07/16/20 0807    Visit Number 11    Date for PT Re-Evaluation 08/04/20    Authorization Type UHC    PT Start Time 0802    PT Stop Time 0842    PT Time Calculation (min) 40 min    Activity Tolerance Patient tolerated treatment well    Behavior During Therapy [facility] for tasks assessed/performed           Past Medical History:  Diagnosis Date  . Anemia   . HIV infection (HCC)   . Thyroid disease     History reviewed. No pertinent surgical history.  There were no vitals filed for this visit.   Subjective Assessment - 07/16/20 0807    Subjective Pt reports she is feeling good this morning    Patient Stated Goals return to exercise, reduce morning pain    Currently in Pain? No/denies                             [facility] Adult PT Treatment/Exercise - 07/16/20 0001      Lumbar Exercises: Stretches   Active Hamstring Stretch Right;20 seconds;3 reps    Hip Flexor Stretch Right;Left   10x each rolling ball back     Lumbar Exercises: Aerobic   Nustep Level 3x 10 minutes- PT present to discuss progress      Lumbar Exercises: Standing   Functional Squats 20 reps    Functional Squats Limitations mini squat - mod/max cues for straight back - blue loop around thighs - hold 10#kettle bell    Forward Lunge 10 reps    Forward Lunge Limitations slider going backf or mini lunge    Shoulder Extension Strengthening;Power Tower;Both   30 reps   Shoulder Extension Limitations 30 lb    Other Standing Lumbar Exercises side step with blue loop - 2x across gym area; diagonal back and flexion with blue loop 15x each       Lumbar Exercises: Supine   Dead Bug Limitations LE march 2lb - 30x each side    Other Supine Lumbar Exercises supine foam roller lengthways - alt UE; diagonals and horizontal abd red band - 30x each    Other Supine Lumbar Exercises supine foam roller - alt LE march and bent knee fall outs - 30x each side      Lumbar Exercises: Sidelying   Hip Abduction Both;15 reps                    PT Short Term Goals - 06/26/20 1388      PT SHORT TERM GOAL #2   Title report a 30% reduction in the frequency and intensity of Rt LBP and Rt LE pain in the mornings    Baseline 60% better    Status Achieved      PT SHORT TERM GOAL #3   Title initiate return to regular exercise routine and verbalize safe progression    Status Achieved             PT Long Term Goals - 07/07/20 0807      PT LONG TERM GOAL #1  Title be independent in advanced HEP    Status On-going      PT LONG TERM GOAL #2   Title report a 70% reduction in Rt sided LBP and Rt LE pain in the early morning hours    Baseline 60% overal improvement    Status On-going      PT LONG TERM GOAL #3   Title reduce FOTO to < or = to 34% limitation      PT LONG TERM GOAL #4   Title demonstrate 4+/5 Rt hip and knee strength to improve functional endurance and improve safety with community function    Baseline hip abduction 4/5    Status On-going      PT LONG TERM GOAL #5   Title return to regular strength and endurance exercise program for improvement of functional endurance required for community function    Status Achieved                 Plan - 07/16/20 0843    Clinical Impression Statement Pt continues to make strength gains and added resistance to standing ex's for hip and core today.  Pt was given red band to add standing ex with resistance to HEP.  Pt was challenged and continues to need some cues to prevent excess thoracic ext.  Continue skilled PT to improved core and hip strength for full return to  function without risk for further injury.    PT Treatment/Interventions ADLs/Self Care Home Management;Cryotherapy;Electrical Stimulation;Traction;Moist Heat;Gait training;Stair training;Functional mobility training;Therapeutic activities;Therapeutic exercise;Neuromuscular re-education;Manual techniques;Patient/family education;Passive range of motion;Dry needling;Joint Manipulations;Spinal Manipulations;Taping    PT Next Visit Plan progress foam roller, squats, lunges with cues for posture    PT Home Exercise Plan Access Code: [email or web address]    Consulted and Agree with Plan of Care Patient           Patient will benefit from skilled therapeutic intervention in order to improve the following deficits and impairments:  Decreased activity tolerance,Decreased strength,Impaired flexibility,Pain,Decreased endurance,Decreased range of motion  Visit Diagnosis: Chronic bilateral low back pain with right-sided sciatica  Cramp and spasm  Muscle weakness (generalized)     Problem List Patient Active Problem List   Diagnosis Date Noted  . Nonallopathic lesion of sacral region 06/13/2020  . Lumbar radiculopathy 05/07/2020  . Acute bursitis of right shoulder 06/27/2019  . Patellofemoral syndrome of left knee 06/27/2019  . Lateral meniscus derangement, left 04/13/2017  . Right rotator cuff tear 06/02/2016  . Pterygium of left eye 05/06/2015  . Unsp injury of left quadriceps musc/fasc/tend, init 02/26/2015  . Hyperlipidemia 09/24/2014  . Pure hypercholesterolemia 04/10/2013  . Elevated hemoglobin A1c 04/10/2013  . Hypothyroidism, postradioiodine therapy 10/31/2008  . Headache(784.0) 03/12/2008  . ANEMIA-NOS 05/04/2006  . ALLERGIC RHINITIS 05/04/2006  . Human immunodeficiency virus (HIV) disease (HCC) 12/07/2001    Junious Silk, PT 07/16/2020, 8:45 AM  [facility] [address] [place], [place], [address] Phone: [phone]    Fax:  [phone]  Name: Brianna Weeks MRN: [id number] Date of Birth: 01/09/1966

## 2020-07-21 ENCOUNTER — Ambulatory Visit: Payer: Managed Care, Other (non HMO)

## 2020-07-21 ENCOUNTER — Other Ambulatory Visit: Payer: Self-pay

## 2020-07-21 DIAGNOSIS — R252 Cramp and spasm: Secondary | ICD-10-CM

## 2020-07-21 DIAGNOSIS — M6281 Muscle weakness (generalized): Secondary | ICD-10-CM

## 2020-07-21 DIAGNOSIS — M5441 Lumbago with sciatica, right side: Secondary | ICD-10-CM | POA: Diagnosis not present

## 2020-07-21 DIAGNOSIS — G8929 Other chronic pain: Secondary | ICD-10-CM

## 2020-07-21 NOTE — Therapy (Signed)
[facility] [address], [address] [place], [place], [address] Phone: [phone]   Fax:  [phone]  Physical Therapy Treatment  Patient Details  Name: Brianna Weeks MRN: [id number] Date of Birth: November 08, 1965 Referring Provider (PT): Antoine Primas, MD   Encounter Date: 07/21/2020   PT End of Session - 07/21/20 0929    Visit Number 12    Date for PT Re-Evaluation 08/04/20    Authorization Type UHC    PT Start Time 0847    PT Stop Time 0928    PT Time Calculation (min) 41 min    Activity Tolerance Patient tolerated treatment well    Behavior During Therapy [facility] for tasks assessed/performed           Past Medical History:  Diagnosis Date   Anemia    HIV infection (HCC)    Thyroid disease     History reviewed. No pertinent surgical history.  There were no vitals filed for this visit.   Subjective Assessment - 07/21/20 0850    Subjective I went to the gym this morning.  I feel 80% better.  I sometimes feel low back pain when laying down.    Currently in Pain? No/denies                             [facility] Adult PT Treatment/Exercise - 07/21/20 0001      Lumbar Exercises: Stretches   Active Hamstring Stretch 2 reps;20 seconds      Lumbar Exercises: Aerobic   Nustep Level 3x 10 minutes- PT present to discuss progress      Lumbar Exercises: Standing   Functional Squats 20 reps    Functional Squats Limitations mini squat - mod/max cues for straight back -  hold 15# Lobbyist    Row Power tower;Both;20 reps    Row Limitations 30#    Shoulder Extension Strengthening;Power Tower;Both   30 reps   Shoulder Extension Limitations 30#    Other Standing Lumbar Exercises walking in reverse 30# x 10      Lumbar Exercises: Supine   Dead Bug Limitations --    Other Supine Lumbar Exercises supine foam roller lengthways - alt UE; diagonals and horizontal abd red band - 30x each    Other Supine Lumbar  Exercises supine foam roller - alt LE march and bent knee fall outs - 30x each side      Lumbar Exercises: Sidelying   Hip Abduction Both;15 reps                    PT Short Term Goals - 06/26/20 7673      PT SHORT TERM GOAL #2   Title report a 30% reduction in the frequency and intensity of Rt LBP and Rt LE pain in the mornings    Baseline 60% better    Status Achieved      PT SHORT TERM GOAL #3   Title initiate return to regular exercise routine and verbalize safe progression    Status Achieved             PT Long Term Goals - 07/07/20 0807      PT LONG TERM GOAL #1   Title be independent in advanced HEP    Status On-going      PT LONG TERM GOAL #2   Title report a 70% reduction in Rt sided LBP and Rt LE pain in the early  morning hours    Baseline 60% overal improvement    Status On-going      PT LONG TERM GOAL #3   Title reduce FOTO to < or = to 34% limitation      PT LONG TERM GOAL #4   Title demonstrate 4+/5 Rt hip and knee strength to improve functional endurance and improve safety with community function    Baseline hip abduction 4/5    Status On-going      PT LONG TERM GOAL #5   Title return to regular strength and endurance exercise program for improvement of functional endurance required for community function    Status Achieved                 Plan - 07/21/20 0903    Clinical Impression Statement Pt continues to make strength gains and has tolerated advancement of strength exercises over the past week.   Pt reports 80% overall improvement in symptoms since the start of care and denies any pain in the past week.  Pt continued challenge with standing hip band exercises and continues to need some cues to prevent excess thoracic ext.  Continue skilled PT to improved core and hip strength for full return to function without risk for further injury.    PT Frequency 2x / week    PT Duration 4 weeks    PT Treatment/Interventions ADLs/Self Care  Home Management;Cryotherapy;Electrical Stimulation;Traction;Moist Heat;Gait training;Stair training;Functional mobility training;Therapeutic activities;Therapeutic exercise;Neuromuscular re-education;Manual techniques;Patient/family education;Passive range of motion;Dry needling;Joint Manipulations;Spinal Manipulations;Taping    PT Next Visit Plan advance hip and core strength, probable D/C by 08/03/20 when pt sees MD    PT Home Exercise Plan Access Code: [email or web address]    Consulted and Agree with Plan of Care Patient           Patient will benefit from skilled therapeutic intervention in order to improve the following deficits and impairments:  Decreased activity tolerance,Decreased strength,Impaired flexibility,Pain,Decreased endurance,Decreased range of motion  Visit Diagnosis: Chronic bilateral low back pain with right-sided sciatica  Cramp and spasm  Muscle weakness (generalized)     Problem List Patient Active Problem List   Diagnosis Date Noted   Nonallopathic lesion of sacral region 06/13/2020   Lumbar radiculopathy 05/07/2020   Acute bursitis of right shoulder 06/27/2019   Patellofemoral syndrome of left knee 06/27/2019   Lateral meniscus derangement, left 04/13/2017   Right rotator cuff tear 06/02/2016   Pterygium of left eye 05/06/2015   Unsp injury of left quadriceps musc/fasc/tend, init 02/26/2015   Hyperlipidemia 09/24/2014   Pure hypercholesterolemia 04/10/2013   Elevated hemoglobin A1c 04/10/2013   Hypothyroidism, postradioiodine therapy 10/31/2008   Headache(784.0) 03/12/2008   ANEMIA-NOS 05/04/2006   ALLERGIC RHINITIS 05/04/2006   Human immunodeficiency virus (HIV) disease (HCC) 12/07/2001     Lorrene Reid, PT 07/21/20 9:30 AM  [facility] [address], [address] [place], [place], [address] Phone: [phone]   Fax:  [phone]  Name: Brianna Weeks MRN: [id number] Date of Birth:  [birth year]

## 2020-07-23 NOTE — Progress Notes (Signed)
   I, Philbert Riser, LAT, ATC acting as a scribe for Clementeen Graham, MD.  Brianna Weeks is a 54 y.o. female who presents to [facility] today for f/u of lumbar radiculopathy after completing 12 PT visits. Pt was last seen by Dr. Denyse Amass on 04/24/20 and was advised to plan for repeat epidural steroid injection, which was not completed, and add amitriptyline at bedtime. Today, pt reports she is feeling much better. Pt some minor discomfort, but no real pain. Pt has continued to prop herself up at night w/ a pillow to help sleep.  Overall she thinks she is about 80% better and is very happy with how things are going.  She needs a note to return to work in person.  She has been working from home during the episode of back pain.  She manages an IT department.  Dx imaging: 04/12/20 L-spine MRI  Pertinent review of systems: No fevers or chills  Relevant historical information: Controlled HIV   Exam:  BP 130/78 (BP Location: Right Arm, Patient Position: Sitting, Cuff Size: Normal)   Pulse 86   Ht 5\' 8"  (1.727 m)   Wt 178 lb 12.8 oz (81.1 kg)   LMP 05/04/2014 (Within Months)   BMI 27.19 kg/m  General: Well Developed, well nourished, and in no acute distress.   MSK: L-spine normal-appearing Normal motion. Normal gait.     Assessment and Plan: 54 y.o. female with low back pain with lumbar radiculopathy.  Significant improvement with physical therapy.  Patient did have 1 epidural steroid injection which did help the leg pain.  Overall doing quite well.  Plan to proceed with home exercise program.  Check back with me as needed.  Happy to reauthorize the dural steroid injection or even more PT in the future if needed.  Discussed precautions.   Discussed warning signs or symptoms. Please see discharge instructions. Patient expresses understanding.   The above documentation has been reviewed and is accurate and complete 57, M.D.    Total encounter time 20  minutes including face-to-face time with the patient and, reviewing past medical record, and charting on the date of service.

## 2020-07-27 ENCOUNTER — Ambulatory Visit (INDEPENDENT_AMBULATORY_CARE_PROVIDER_SITE_OTHER): Payer: Managed Care, Other (non HMO) | Admitting: Family Medicine

## 2020-07-27 ENCOUNTER — Other Ambulatory Visit: Payer: Self-pay

## 2020-07-27 VITALS — BP 130/78 | HR 86 | Ht 68.0 in | Wt 178.8 lb

## 2020-07-27 DIAGNOSIS — M5416 Radiculopathy, lumbar region: Secondary | ICD-10-CM

## 2020-07-27 NOTE — Patient Instructions (Addendum)
Thank you for coming in today.  Return as needed.   Happy to authorize more PT or a repeat injection in the future if needed.   I hope you do not need to come back any time soon.

## 2020-07-28 ENCOUNTER — Other Ambulatory Visit: Payer: Self-pay

## 2020-07-28 ENCOUNTER — Ambulatory Visit: Payer: Managed Care, Other (non HMO) | Attending: Family Medicine

## 2020-07-28 DIAGNOSIS — G8929 Other chronic pain: Secondary | ICD-10-CM | POA: Diagnosis present

## 2020-07-28 DIAGNOSIS — M5441 Lumbago with sciatica, right side: Secondary | ICD-10-CM | POA: Insufficient documentation

## 2020-07-28 DIAGNOSIS — M6281 Muscle weakness (generalized): Secondary | ICD-10-CM | POA: Diagnosis present

## 2020-07-28 DIAGNOSIS — R252 Cramp and spasm: Secondary | ICD-10-CM

## 2020-07-28 NOTE — Therapy (Signed)
[facility] [address] [place], [place], [address] Phone: [phone]   Fax:  [phone]  Physical Therapy Treatment  Patient Details  Name: Brianna Weeks MRN: [id number] Date of Birth: [birth year] Referring Provider (PT): Antoine Primas, MD   Encounter Date: 07/28/2020   PT End of Session - 07/28/20 0837    Visit Number 13    Date for PT Re-Evaluation 08/04/20    Authorization Type UHC    PT Start Time 0803    PT Stop Time 0837    PT Time Calculation (min) 34 min    Activity Tolerance Patient tolerated treatment well    Behavior During Therapy [facility] for tasks assessed/performed           Past Medical History:  Diagnosis Date  . Anemia   . HIV infection (HCC)   . Thyroid disease     History reviewed. No pertinent surgical history.  There were no vitals filed for this visit.   Subjective Assessment - 07/28/20 0806    Subjective I saw the MD yesterday and he was pleased with my progress.  I am ready for D/C.    Currently in Pain? No/denies              [facility] PT Assessment - 07/28/20 0001      Strength   Right Hip Flexion 5/5    Right Hip ABduction 4+/5    Right Knee Flexion 5/5    Right Knee Extension 5/5                         [facility] Adult PT Treatment/Exercise - 07/28/20 0001      Lumbar Exercises: Aerobic   Stationary Bike Bike: level 2x 8 minutes-PT present to discuss progress      Lumbar Exercises: Standing   Functional Squats 20 reps    Functional Squats Limitations mini squat - mod/max cues for straight back -  hold 15# kettle bell    Forward Lunge 10 reps    Forward Lunge Limitations slider going backf or mini lunge    Row Power tower;Both;20 reps    Row Limitations 30#    Shoulder Extension Strengthening;Power Tower;Both   30 reps   Shoulder Extension Limitations 30#    Other Standing Lumbar Exercises walking in reverse 30# x 10      Lumbar Exercises: Supine    Other Supine Lumbar Exercises supine foam roller lengthways - alt UE; diagonals and horizontal abd red band - 30x each    Other Supine Lumbar Exercises supine foam roller - alt LE march and bent knee fall outs - 30x each side                    PT Short Term Goals - 06/26/20 6073      PT SHORT TERM GOAL #2   Title report a 30% reduction in the frequency and intensity of Rt LBP and Rt LE pain in the mornings    Baseline 60% better    Status Achieved      PT SHORT TERM GOAL #3   Title initiate return to regular exercise routine and verbalize safe progression    Status Achieved             PT Long Term Goals - 07/28/20 0809      PT LONG TERM GOAL #2   Title report a 70% reduction in Rt sided LBP and Rt LE  pain in the early morning hours    Baseline 80% limitation    Status Achieved      PT LONG TERM GOAL #4   Title demonstrate 4+/5 Rt hip and knee strength to improve functional endurance and improve safety with community function    Status Achieved      PT LONG TERM GOAL #5   Title return to regular strength and endurance exercise program for improvement of functional endurance required for community function    Status Achieved                 Plan - 07/28/20 0821    Clinical Impression Statement Pt continues to make strength gains and has tolerated advancement of strength exercises over the past 2 weeks.   Pt reports 80% overall improvement in symptoms since the start of care and denies any pain in the past 2 weeks.  Pt with improved Rt hip and knee strength with MMT today.  Pt requires min verbal cues to avoid excessive thoracic extension with squats with weight.   Pt will attend 1 more session to finalize HEP prior to D/C.    PT Frequency 2x / week    PT Duration 4 weeks    PT Treatment/Interventions ADLs/Self Care Home Management;Cryotherapy;Electrical Stimulation;Traction;Moist Heat;Gait training;Stair training;Functional mobility training;Therapeutic  activities;Therapeutic exercise;Neuromuscular re-education;Manual techniques;Patient/family education;Passive range of motion;Dry needling;Joint Manipulations;Spinal Manipulations;Taping    PT Next Visit Plan 1 more session to finalize HEP, FOTO           Patient will benefit from skilled therapeutic intervention in order to improve the following deficits and impairments:  Decreased activity tolerance,Decreased strength,Impaired flexibility,Pain,Decreased endurance,Decreased range of motion  Visit Diagnosis: Chronic bilateral low back pain with right-sided sciatica  Cramp and spasm  Muscle weakness (generalized)     Problem List Patient Active Problem List   Diagnosis Date Noted  . Nonallopathic lesion of sacral region 06/13/2020  . Lumbar radiculopathy 05/07/2020  . Acute bursitis of right shoulder 06/27/2019  . Patellofemoral syndrome of left knee 06/27/2019  . Lateral meniscus derangement, left 04/13/2017  . Right rotator cuff tear 06/02/2016  . Pterygium of left eye 05/06/2015  . Unsp injury of left quadriceps musc/fasc/tend, init 02/26/2015  . Hyperlipidemia 09/24/2014  . Pure hypercholesterolemia 04/10/2013  . Elevated hemoglobin A1c 04/10/2013  . Hypothyroidism, postradioiodine therapy 10/31/2008  . Headache(784.0) 03/12/2008  . ANEMIA-NOS 05/04/2006  . ALLERGIC RHINITIS 05/04/2006  . Human immunodeficiency virus (HIV) disease (HCC) 12/07/2001    Lorrene Reid, PT 07/28/20 8:38 AM  [facility] [address], [address] [place], [place], [address] Phone: [phone]   Fax:  [phone]  Name: Brianna Weeks MRN: [id number] Date of Birth: [birth year]

## 2020-07-30 ENCOUNTER — Other Ambulatory Visit: Payer: Self-pay

## 2020-07-30 ENCOUNTER — Ambulatory Visit: Payer: Managed Care, Other (non HMO)

## 2020-07-30 DIAGNOSIS — M5441 Lumbago with sciatica, right side: Secondary | ICD-10-CM | POA: Diagnosis not present

## 2020-07-30 DIAGNOSIS — R252 Cramp and spasm: Secondary | ICD-10-CM

## 2020-07-30 DIAGNOSIS — M6281 Muscle weakness (generalized): Secondary | ICD-10-CM

## 2020-07-30 DIAGNOSIS — G8929 Other chronic pain: Secondary | ICD-10-CM

## 2020-07-30 NOTE — Therapy (Signed)
[facility] [address], [place], [place], [address] Phone: [phone]   Fax:  [phone]  Physical Therapy Treatment  Patient Details  Name: Brianna Weeks MRN: [id number] Date of Birth: 09-May-1966 Referring Provider (PT): Hulan Saas, MD   Encounter Date: 07/30/2020   PT End of Session - 07/30/20 0843    Visit Number 14    PT Start Time 0805    PT Stop Time 0843    PT Time Calculation (min) 38 min    Activity Tolerance Patient tolerated treatment well    Behavior During Therapy [facility] for tasks assessed/performed           Past Medical History:  Diagnosis Date   Anemia    HIV infection ([place])    Thyroid disease     History reviewed. No pertinent surgical history.  There were no vitals filed for this visit.   Subjective Assessment - 07/30/20 0810    Subjective I feel 85% better overall.  I'm leaving on Saturday for a 3 week trip.    Currently in Pain? No/denies              [facility] PT Assessment - 07/30/20 0001      Assessment   Medical Diagnosis lumbar radiculopathy    Referring Provider (PT) Hulan Saas, MD    Onset Date/Surgical Date 03/31/20      Prior Function   Level of Independence Independent      Observation/Other Assessments   Focus on Therapeutic Outcomes (FOTO)  17% limitation      Strength   Right Hip Flexion 5/5    Right Hip ABduction 4+/5    Right Knee Flexion 5/5    Right Knee Extension 5/5                         [facility] Adult PT Treatment/Exercise - 07/30/20 0001      Lumbar Exercises: Stretches   Active Hamstring Stretch 2 reps;20 seconds      Lumbar Exercises: Aerobic   Nustep Level 3x 8 minutes- PT present to discuss progress      Lumbar Exercises: Standing   Functional Squats 20 reps    Functional Squats Limitations mini squat - mod/max cues for straight back -  hold 15# kettle bell    Forward Lunge 10 reps    Forward Lunge Limitations  slider going back, lateral and forward or mini lunge    Row Power tower;Both;20 reps    Row Limitations 30#    Shoulder Extension Strengthening;Power Tower;Both   30 reps   Shoulder Extension Limitations 30#    Other Standing Lumbar Exercises walking in reverse 30# x 10      Lumbar Exercises: Supine   Other Supine Lumbar Exercises supine foam roller lengthways - alt UE; diagonals and horizontal abd red band - 30x each    Other Supine Lumbar Exercises supine foam roller - alt LE march and bent knee fall outs - 30x each side                    PT Short Term Goals - 06/26/20 8022      PT SHORT TERM GOAL #2   Title report a 30% reduction in the frequency and intensity of Rt LBP and Rt LE pain in the mornings    Baseline 60% better    Status Achieved      PT SHORT TERM GOAL #3  Title initiate return to regular exercise routine and verbalize safe progression    Status Achieved             PT Long Term Goals - 07/30/20 0810      PT LONG TERM GOAL #1   Title be independent in advanced HEP    Status Achieved      PT LONG TERM GOAL #2   Title report a 70% reduction in Rt sided LBP and Rt LE pain in the early morning hours    Baseline 80% improvement    Status Achieved      PT LONG TERM GOAL #3   Title reduce FOTO to < or = to 34% limitation    Status Achieved      PT LONG TERM GOAL #4   Title demonstrate 4+/5 Rt hip and knee strength to improve functional endurance and improve safety with community function    Baseline --    Status Achieved      PT LONG TERM GOAL #5   Title return to regular strength and endurance exercise program for improvement of functional endurance required for community function    Status Achieved                 Plan - 07/30/20 0854    Clinical Impression Statement Pt reports 85% overall improvement in symptoms since the start of care.  Pt is consistent in a regular home program and gym program for strength, endurance and  flexibility.  Pt denies any leg pain and strength is improved in the Rt hip and knee.  FOTO is 17% limitation.  Pt has met all goals.  PT will D/C to HEP today.    PT Next Visit Plan D/C PT to HEP    PT Home Exercise Plan Access Code: [email or web address]    Consulted and Agree with Plan of Care Patient           Patient will benefit from skilled therapeutic intervention in order to improve the following deficits and impairments:     Visit Diagnosis: Chronic bilateral low back pain with right-sided sciatica  Cramp and spasm  Muscle weakness (generalized)     Problem List Patient Active Problem List   Diagnosis Date Noted   Nonallopathic lesion of sacral region 06/13/2020   Lumbar radiculopathy 05/07/2020   Acute bursitis of right shoulder 06/27/2019   Patellofemoral syndrome of left knee 06/27/2019   Lateral meniscus derangement, left 04/13/2017   Right rotator cuff tear 06/02/2016   Pterygium of left eye 05/06/2015   Unsp injury of left quadriceps musc/fasc/tend, init 02/26/2015   Hyperlipidemia 09/24/2014   Pure hypercholesterolemia 04/10/2013   Elevated hemoglobin A1c 04/10/2013   Hypothyroidism, postradioiodine therapy 10/31/2008   Headache(784.0) 03/12/2008   ANEMIA-NOS 05/04/2006   ALLERGIC RHINITIS 05/04/2006   Human immunodeficiency virus (HIV) disease ([place]) 12/07/2001   PHYSICAL THERAPY DISCHARGE SUMMARY  Visits from Start of Care: 14  Current functional level related to goals / functional outcomes: See above for current PT status.  No functional deficits remain.     Remaining deficits: See above   Education / Equipment: HEP, posture/mechanics Plan: Patient agrees to discharge.  Patient goals were not met. Patient is being discharged due to meeting the stated rehab goals.  ?????      Sigurd Sos, PT 07/30/20 8:55 AM  [facility] [address] [place], [place], [address] Phone:  [phone]   Fax:  [phone]  Name: Brianna Weeks MRN: [id number] Date of Birth: 1965-09-04

## 2020-08-03 ENCOUNTER — Ambulatory Visit: Payer: Managed Care, Other (non HMO) | Admitting: Family Medicine

## 2020-10-02 ENCOUNTER — Other Ambulatory Visit: Payer: Self-pay | Admitting: Infectious Diseases

## 2020-10-02 DIAGNOSIS — B20 Human immunodeficiency virus [HIV] disease: Secondary | ICD-10-CM

## 2020-10-08 ENCOUNTER — Ambulatory Visit: Payer: Self-pay

## 2020-10-08 ENCOUNTER — Ambulatory Visit (INDEPENDENT_AMBULATORY_CARE_PROVIDER_SITE_OTHER): Payer: Managed Care, Other (non HMO) | Admitting: Family Medicine

## 2020-10-08 ENCOUNTER — Other Ambulatory Visit: Payer: Self-pay

## 2020-10-08 ENCOUNTER — Encounter: Payer: Self-pay | Admitting: Family Medicine

## 2020-10-08 VITALS — BP 114/70 | Ht 68.0 in | Wt 178.0 lb

## 2020-10-08 DIAGNOSIS — M25561 Pain in right knee: Secondary | ICD-10-CM

## 2020-10-08 DIAGNOSIS — M222X2 Patellofemoral disorders, left knee: Secondary | ICD-10-CM

## 2020-10-08 DIAGNOSIS — G8929 Other chronic pain: Secondary | ICD-10-CM

## 2020-10-08 DIAGNOSIS — M25562 Pain in left knee: Secondary | ICD-10-CM | POA: Diagnosis not present

## 2020-10-08 NOTE — Progress Notes (Signed)
[facility] [address] [place] [address] Phone: [phone] Subjective:   Brianna Weeks, am serving as a scribe for Dr. Antoine Weeks. This visit occurred during the SARS-CoV-2 public health emergency.  Safety protocols were in place, including screening questions prior to the visit, additional usage of staff PPE, and extensive cleaning of exam room while observing appropriate contact time as indicated for disinfecting solutions.   I'm seeing this patient by the request  of:  Brianna Montana, MD  CC: left knee pain   [id number]  Brianna Weeks is a 55 y.o. female coming in with complaint of B knee pain. History of PF from 2020. Patient last seen in November 2021 for OMT. Patient states that pain is in front of knee that radiates up into the thigh. Taking Aleve for pain relief.    NEED Xray     MRI of lumbar :IMPRESSION: 1. At L4-5 there is a broad-based disc bulge with small left foraminal disc extrusion. Moderate bilateral facet arthropathy with bilateral facet effusions. 2. At L3-4 there is a mild broad-based disc bulge. Moderate bilateral facet arthropathy with bilateral facet effusions. Mild bilateral foraminal stenosis.  Past Medical History:  Diagnosis Date  . Anemia   . HIV infection (HCC)   . Thyroid disease    No past surgical history on file. Social History   Socioeconomic History  . Marital status: Unknown    Spouse name: Not on file  . Number of children: Not on file  . Years of education: Not on file  . Highest education level: Not on file  Occupational History  . Not on file  Tobacco Use  . Smoking status: Never Smoker  . Smokeless tobacco: Never Used  Substance and Sexual Activity  . Alcohol use: No    Alcohol/week: 0.0 standard drinks  . Drug use: No  . Sexual activity: Not Currently    Partners: Male    Comment: pt. given condoms  Other Topics Concern  . Not on file  Social History Narrative   ** Merged  History Encounter **       Social Determinants of Health   Financial Resource Strain: Not on file  Food Insecurity: Not on file  Transportation Needs: Not on file  Physical Activity: Not on file  Stress: Not on file  Social Connections: Not on file   Allergies  Allergen Reactions  . Ciprofloxacin Nausea And Vomiting   Family History  Problem Relation Age of Onset  . Hypertension Mother     Current Outpatient Medications (Endocrine & Metabolic):  .  levothyroxine (SYNTHROID) 175 MCG tablet, levothyroxine sodium 175 mcg tabs  Current Outpatient Medications (Cardiovascular):  .  nitroGLYCERIN (NITRODUR - DOSED IN MG/24 HR) 0.2 mg/hr patch, 1/4 patch daily  Current Outpatient Medications (Respiratory):  .  fexofenadine-pseudoephedrine (ALLEGRA-D) 60-120 MG per tablet, Take 1 tablet by mouth 2 (two) times daily.  Current Outpatient Medications (Analgesics):  .  butalbital-acetaminophen-caffeine (FIORICET, ESGIC) 50-325-40 MG per tablet, Take 1 tablet by mouth 2 (two) times daily as needed for headache. .  meloxicam (MOBIC) 15 MG tablet, Take 15 mg by mouth daily. Reported on 10/28/2015 .  naproxen (NAPROSYN) 500 MG tablet, Take 1 tablet (500 mg total) by mouth 2 (two) times daily with a meal. .  SUMAtriptan (IMITREX) 25 MG tablet, Take 1 tablet (25 mg total) by mouth every 2 (two) hours as needed for migraine (no more than 4 per day).   Current Outpatient Medications (  Other):  .  amitriptyline (ELAVIL) 25 MG tablet, TAKE 1-2 TABLETS (25-50 MG TOTAL) BY MOUTH AT BEDTIME. FOR PAIN AND SLEEP .  [name] 50-25 MG TABS, TAKE 1 TABLET BY MOUTH EVERY DAY WITH BREAKFAST .  LORazepam (ATIVAN) 0.5 MG tablet, 1-2 tabs 30 - 60 min prior to MRI. Do not drive with this medicine.   Reviewed prior external information including notes and imaging from  primary care provider As well as notes that were available from care everywhere and other healthcare systems.  Past medical history, social,  surgical and family history all reviewed in electronic medical record.  No pertanent information unless stated regarding to the chief complaint.   Review of Systems:  No headache, visual changes, nausea, vomiting, diarrhea, constipation, dizziness, abdominal pain, skin rash, fevers, chills, night sweats, weight loss, swollen lymph nodes, body aches, joint swelling, chest pain, shortness of breath, mood changes. POSITIVE muscle aches  Objective  Blood pressure 114/70, height 5\' 8"  (1.727 m), weight 178 lb (80.7 kg), last menstrual period 05/04/2014.   General: No apparent distress alert and oriented x3 mood and affect normal, dressed appropriately.  HEENT: Pupils equal, extraocular movements intact  Respiratory: Patient's speak in full sentences and does not appear short of breath  Cardiovascular: No lower extremity edema, non tender, no erythema  Gait normal with good balance and coordination.  MSK: Patient does have mild lateral tracking patella bilaterally.  Positive patellar grind test noted.  Patient does have mild lateral tracking of the contralateral knee as well.  No instability of the knee otherwise.  Limited musculoskeletal ultrasound was performed and interpreted by 07/04/2014  Limited ultrasound of patient's left knee shows there is some mild narrowing of the patellofemoral joint.  Trace effusion noted of the patellofemoral joint.  Otherwise patient medial and lateral joint space seems to be unremarkable.  Right side shows trace effusion but otherwise unremarkable right. Impression: Patellofemoral mild arthritis with mild effusion    Impression and Recommendations:     The above documentation has been reviewed and is accurate and complete Brianna Saa, DO

## 2020-10-08 NOTE — Patient Instructions (Signed)
Good to see you Exercise 3 times a week Xrays on the way out Use pennsaid then swith to voltaren See me again in 6-8 weeks

## 2020-10-08 NOTE — Assessment & Plan Note (Signed)
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  Seems to be bilateral knees.  Patient does have meloxicam and can take this if necessary.  We discussed with patient about icing regimen.  Given a Tru pull lite brace for the left side.  X-rays are pending of the knees bilaterally.  Worsening pain will consider formal physical therapy as well as possible injections.  Follow-up with me again in 6 to 8 weeks

## 2020-12-02 ENCOUNTER — Ambulatory Visit: Payer: Managed Care, Other (non HMO) | Admitting: Family Medicine

## 2021-02-02 ENCOUNTER — Encounter: Payer: Self-pay | Admitting: Infectious Diseases

## 2021-02-02 ENCOUNTER — Ambulatory Visit: Payer: Managed Care, Other (non HMO) | Admitting: Infectious Diseases

## 2021-02-02 ENCOUNTER — Other Ambulatory Visit (HOSPITAL_COMMUNITY)
Admission: RE | Admit: 2021-02-02 | Discharge: 2021-02-02 | Disposition: A | Discharge status: Home / Self Care | Payer: Managed Care, Other (non HMO) | Source: Ambulatory Visit | Attending: Infectious Diseases | Admitting: Infectious Diseases

## 2021-02-02 ENCOUNTER — Other Ambulatory Visit: Payer: Self-pay

## 2021-02-02 VITALS — BP 172/100 | HR 99 | Temp 99.4°F | Wt 174.2 lb

## 2021-02-02 DIAGNOSIS — B20 Human immunodeficiency virus [HIV] disease: Secondary | ICD-10-CM

## 2021-02-02 DIAGNOSIS — Z113 Encounter for screening for infections with a predominantly sexual mode of transmission: Secondary | ICD-10-CM | POA: Insufficient documentation

## 2021-02-02 DIAGNOSIS — I10 Essential (primary) hypertension: Secondary | ICD-10-CM | POA: Diagnosis not present

## 2021-02-02 DIAGNOSIS — Z79899 Other long term (current) drug therapy: Secondary | ICD-10-CM

## 2021-02-02 DIAGNOSIS — M5416 Radiculopathy, lumbar region: Secondary | ICD-10-CM

## 2021-02-02 NOTE — Assessment & Plan Note (Signed)
Has improved, she has gotten back to the gym.

## 2021-02-02 NOTE — Assessment & Plan Note (Signed)
She is doing well Not interested in [place] She is worried about loss of insurance, I asked her to call if this happens so we can assist.  Will get her labs today email her with results as usual.  rtc in 1 yr

## 2021-02-02 NOTE — Assessment & Plan Note (Signed)
I repeated her BP, 154/90 Will ask her to f/u with PCP,  Has visit with MD tomorrow, f/u.

## 2021-02-02 NOTE — Progress Notes (Signed)
   Subjective:    Patient ID: Brianna Weeks, female  DOB: 1966/02/24, 55 y.o.        MRN: [id number]   HPI 56 yo [place] F with HIV+ and Graves disease. Recieved I-131 (07-21-08).   Atrpla/DTGV --> odefsey/tivicay --> juluca (2019)   Had prev nl pap and mammo (2021). Has done at [facility].  Has f/u tomorrow, she will ask them to email [place].  Kids are 63 yo [facility]), 55 yo, 55 yo (just grad from [employer]).     No problems with juluca.  She had TSH checked at her work today, result pending.  Worried she may lose her insurance.   HIV 1 RNA Quant (copies/mL)  Date Value  02/11/2020 <20 NOT DETECTED  02/05/2019 <20 DETECTED (A)  01/24/2018 <20 NOT DETECTED   CD4 T Cell Abs (/uL)  Date Value  02/11/2020 1,435  02/05/2019 1,136  01/24/2018 1,200     Health Maintenance  Topic Date Due   TETANUS/TDAP  Never done   Zoster Vaccines- Shingrix (1 of 2) Never done   PAP SMEAR-Modifier  04/07/2016   MAMMOGRAM  06/17/2016   COVID-19 Vaccine (3 - Pfizer risk series) 01/11/2020   INFLUENZA VACCINE  02/22/2021   COLONOSCOPY (Pts 45-46yrs Insurance coverage will need to be confirmed)  07/25/2025   Pneumococcal Vaccine 44-55 Years old (4 - PPSV23 or PCV20) 08/10/2030   Hepatitis C Screening  Completed   HIV Screening  Completed   HPV VACCINES  Aged Out      Review of Systems  Constitutional:  Negative for chills, diaphoresis, fever and weight loss.  Respiratory:  Negative for cough.   Cardiovascular:  Negative for chest pain.  Gastrointestinal:  Negative for constipation and diarrhea.  Genitourinary:  Negative for dysuria.  Neurological:  Negative for headaches.  Psychiatric/Behavioral:  The patient is not nervous/anxious.    Please see HPI. All other systems reviewed and negative.     Objective:  Physical Exam Vitals reviewed.  Constitutional:      General: She is not in acute distress.    Appearance: She is not ill-appearing or diaphoretic.  HENT:      Mouth/Throat:     Mouth: Mucous membranes are moist.     Pharynx: No oropharyngeal exudate.  Eyes:     Extraocular Movements: Extraocular movements intact.     Pupils: Pupils are equal, round, and reactive to light.  Cardiovascular:     Rate and Rhythm: Normal rate and regular rhythm.  Pulmonary:     Effort: Pulmonary effort is normal.     Breath sounds: Normal breath sounds.  Abdominal:     General: Bowel sounds are normal. There is no distension.     Palpations: Abdomen is soft.     Tenderness: There is no abdominal tenderness.  Musculoskeletal:        General: Normal range of motion.     Cervical back: Normal range of motion and neck supple.     Right lower leg: No edema.     Left lower leg: No edema.  Neurological:     General: No focal deficit present.     Mental Status: She is alert.  Psychiatric:        Mood and Affect: Mood normal.           Assessment & Plan:

## 2021-02-03 LAB — T-HELPER CELL (CD4) - (RCID CLINIC ONLY)
CD4 % Helper T Cell: 40 % (ref 33–65)
CD4 T Cell Abs: 1321 /uL (ref 400–1790)

## 2021-02-04 LAB — COMPREHENSIVE METABOLIC PANEL
AG Ratio: 1.7 (calc) (ref 1.0–2.5)
ALT: 19 U/L (ref 6–29)
AST: 21 U/L (ref 10–35)
Albumin: 4.6 g/dL (ref 3.6–5.1)
Alkaline phosphatase (APISO): 66 U/L (ref 37–153)
BUN: 14 mg/dL (ref 7–25)
CO2: 28 mmol/L (ref 20–32)
Calcium: 10.2 mg/dL (ref 8.6–10.4)
Chloride: 103 mmol/L (ref 98–110)
Creat: 0.96 mg/dL (ref 0.50–1.03)
Globulin: 2.7 g/dL (calc) (ref 1.9–3.7)
Glucose, Bld: 88 mg/dL (ref 65–99)
Potassium: 4.1 mmol/L (ref 3.5–5.3)
Sodium: 140 mmol/L (ref 135–146)
Total Bilirubin: 0.3 mg/dL (ref 0.2–1.2)
Total Protein: 7.3 g/dL (ref 6.1–8.1)

## 2021-02-04 LAB — LIPID PANEL
Cholesterol: 213 mg/dL — ABNORMAL HIGH (ref <200)
HDL: 80 mg/dL (ref >=50)
LDL Cholesterol (Calc): 112 mg/dL (calc) — ABNORMAL HIGH
Non-HDL Cholesterol (Calc): 133 mg/dL (calc) — ABNORMAL HIGH (ref <130)
Total CHOL/HDL Ratio: 2.7 (calc) (ref <5.0)
Triglycerides: 100 mg/dL (ref <150)

## 2021-02-04 LAB — CBC
HCT: 42.5 % (ref 35.0–45.0)
Hemoglobin: 13.4 g/dL (ref 11.7–15.5)
MCH: 27.7 pg (ref 27.0–33.0)
MCHC: 31.5 g/dL — ABNORMAL LOW (ref 32.0–36.0)
MCV: 87.8 fL (ref 80.0–100.0)
MPV: 11.2 fL (ref 7.5–12.5)
Platelets: 251 10*3/uL (ref 140–400)
RBC: 4.84 10*6/uL (ref 3.80–5.10)
RDW: 13.1 % (ref 11.0–15.0)
WBC: 7.2 10*3/uL (ref 3.8–10.8)

## 2021-02-04 LAB — URINE CYTOLOGY ANCILLARY ONLY
Chlamydia: NEGATIVE
Comment: NEGATIVE
Comment: NORMAL
Neisseria Gonorrhea: NEGATIVE

## 2021-02-04 LAB — HIV-1 RNA QUANT-NO REFLEX-BLD
HIV 1 RNA Quant: <20 Copies/mL — ABNORMAL HIGH
HIV-1 RNA Quant, Log: <1.3 Log cps/mL — ABNORMAL HIGH

## 2021-02-04 LAB — RPR: RPR Ser Ql: NONREACTIVE

## 2021-02-11 ENCOUNTER — Ambulatory Visit: Payer: 59 | Admitting: Infectious Diseases

## 2021-03-05 ENCOUNTER — Other Ambulatory Visit: Payer: Self-pay | Admitting: Family Medicine

## 2021-03-05 DIAGNOSIS — R1031 Right lower quadrant pain: Secondary | ICD-10-CM

## 2021-03-24 ENCOUNTER — Other Ambulatory Visit: Payer: Self-pay

## 2021-03-24 ENCOUNTER — Ambulatory Visit
Admission: RE | Admit: 2021-03-24 | Discharge: 2021-03-24 | Disposition: A | Discharge status: Home / Self Care | Payer: Managed Care, Other (non HMO) | Source: Ambulatory Visit | Attending: Family Medicine | Admitting: Family Medicine

## 2021-03-24 DIAGNOSIS — R1031 Right lower quadrant pain: Secondary | ICD-10-CM

## 2021-03-24 MED ORDER — IOPAMIDOL (ISOVUE-300) INJECTION 61%
100.0000 mL | Freq: Once | INTRAVENOUS | Status: AC | PRN
  Administered 2021-03-24: 100 mL via INTRAVENOUS

## 2021-04-20 ENCOUNTER — Other Ambulatory Visit: Payer: Self-pay | Admitting: Infectious Diseases

## 2021-04-20 DIAGNOSIS — B20 Human immunodeficiency virus [HIV] disease: Secondary | ICD-10-CM

## 2021-05-31 NOTE — Progress Notes (Addendum)
[name] [address] [place] [address] Phone: [phone]   Assessment and Plan:     1. Acute bilateral thoracic back pain 2. Strain of trapezius muscle, unspecified laterality, initial encounter 3. Neck pain -Chronic with exacerbation - Patient is experienced chronic intermittent upper back and neck pain, but has a particularly intense flare starting last Friday, 05/28/2021 after heavy workout the day before which likely caused muscular strains - No red flag symptoms, so no repeat imaging performed today - Start meloxicam 15 mg daily x2 weeks and then may use remainder as needed for pain control - Start Flexeril 5 mg every 8 hours as needed for muscle spasms.  Instructed to start with medication at night to see how she tolerates side effects.  Do not recommend driving or working on this medication - OMT not performed today due to patient tenderness    Pertinent previous records reviewed include none   Follow Up: - Follow-up in 2 days for reevaluation before her trip to [place] on 06/04/21.  Could consider trigger point injections versus OMT at that time.     Subjective:   I, Debbe Odea, am serving as a scribe for Dr. Richardean Sale  Chief Complaint: bilateral shoulder pain   HPI:   06/01/21 Patient is a 55 year old female presenting with bilateral shoulder pain that has been going on since Friday. Patient states no MOI woke up Friday morning with the pain. Patient locates pain to the right shoulder blade that will radiate down the lateral right side, across the top of her back to the left shoulder and down the lateral left side. Patient states the pain also goes up into her neck. Patient is unable to turn or twist, walks and sit or turn by moving her whole body, patient has be taking ibuprofen, aleve, ice and heat.       Relevant Historical Information: Hypertension, HIV  Additional pertinent review of  systems negative.   Current Outpatient Medications:    cyclobenzaprine (FLEXERIL) 5 MG tablet, Take 1 tablet (5 mg total) by mouth 3 (three) times daily as needed for muscle spasms., Disp: 30 tablet, Rfl: 0   [name] 50-25 MG TABS, TAKE 1 TABLET BY MOUTH EVERY DAY WITH BREAKFAST, Disp: 30 tablet, Rfl: 5   levothyroxine (SYNTHROID) 175 MCG tablet, levothyroxine sodium 175 mcg tabs, Disp: , Rfl:    meloxicam (MOBIC) 15 MG tablet, Take 1 tablet (15 mg total) by mouth daily., Disp: 30 tablet, Rfl: 0   Objective:     Vitals:   06/01/21 1116  BP: 136/70  Pulse: 83  SpO2: 96%  Height: 5\' 8"  (1.727 m)      Body mass index is 26.49 kg/m.    Physical Exam:    Gen: Appears well, nad, nontoxic and pleasant Psych: Alert and oriented, appropriate mood and affect Neuro: sensation intact, strength is 5/5 in upper and lower extremities, muscle tone wnl Skin: no susupicious lesions or rashes  Back - Normal skin, Spine with normal alignment and no deformity.   No tenderness to vertebral process palpation.   Significant bilateral paraspinous muscles tenderness C-spine and T-spine with muscle spasms Straight leg raise negative  Cervical Spine: Posture normal;  Neck ROM: Significantly limited in flexion, extension, rotation, side bending due to muscle spasms and pain  Neurological:  Deep Tendon Reflexes:   Right  Left   Braachioradialis 2+ 2+  Bicep 2+  2+    Strength  Right  Left   Bicep 5/5  5/5  Tricep 5/5 5/5  Grip 5/5 5/5  Deltoid 5/5 5/5   Sensation: intact to light touch Skin: normal, intact Spurling's:  negative bilaterally TTP: cervical paraspinal, cervical spinous processes, thoracic paraspinal, trapezius    Electronically signed by:  [name] 11:52 AM 06/01/21

## 2021-06-01 ENCOUNTER — Ambulatory Visit: Payer: Managed Care, Other (non HMO) | Admitting: Sports Medicine

## 2021-06-01 ENCOUNTER — Other Ambulatory Visit: Payer: Self-pay

## 2021-06-01 VITALS — BP 136/70 | HR 83 | Ht 68.0 in

## 2021-06-01 DIAGNOSIS — M542 Cervicalgia: Secondary | ICD-10-CM

## 2021-06-01 DIAGNOSIS — M546 Pain in thoracic spine: Secondary | ICD-10-CM | POA: Diagnosis not present

## 2021-06-01 DIAGNOSIS — S46819A Strain of other muscles, fascia and tendons at shoulder and upper arm level, unspecified arm, initial encounter: Secondary | ICD-10-CM

## 2021-06-01 MED ORDER — CYCLOBENZAPRINE HCL 5 MG PO TABS: 5.0000 mg | ORAL_TABLET | Freq: Three times a day (TID) | ORAL | 0 refills | Status: AC | PRN

## 2021-06-01 MED ORDER — MELOXICAM 15 MG PO TABS: 15.0000 mg | ORAL_TABLET | Freq: Every day | ORAL | 0 refills | Status: DC

## 2021-06-01 NOTE — Patient Instructions (Signed)
Meloxicam 15mg  daily for two weeks remainder as needed  Can start flexeril 5mg  every 8 hours as needed for muscle spasms Recommend starting at night to see how drowsy it makes you do not recommend driving on this medication Please see back in two days on Thursday.

## 2021-06-02 NOTE — Progress Notes (Deleted)
    [name] [address] [place] [address] Phone: [phone]   Assessment and Plan:     There are no diagnoses linked to this encounter.  ***   Pertinent previous records reviewed include ***   Follow Up: ***     Subjective:    Chief Complaint: Neck and thoracic back pain   HPI:   06/01/21 Patient is a 55 year old female presenting with bilateral shoulder pain that has been going on since Friday. Patient states no MOI woke up Friday morning with the pain. Patient locates pain to the right shoulder blade that will radiate down the lateral right side, across the top of her back to the left shoulder and down the lateral left side. Patient states the pain also goes up into her neck. Patient is unable to turn or twist, walks and sit or turn by moving her whole body, patient has be taking ibuprofen, aleve, ice and heat.   06/03/21 Patient states   Relevant Historical Information: ***  Additional pertinent review of systems negative.   Current Outpatient Medications:    cyclobenzaprine (FLEXERIL) 5 MG tablet, Take 1 tablet (5 mg total) by mouth 3 (three) times daily as needed for muscle spasms., Disp: 30 tablet, Rfl: 0   [name] 50-25 MG TABS, TAKE 1 TABLET BY MOUTH EVERY DAY WITH BREAKFAST, Disp: 30 tablet, Rfl: 5   levothyroxine (SYNTHROID) 175 MCG tablet, levothyroxine sodium 175 mcg tabs, Disp: , Rfl:    meloxicam (MOBIC) 15 MG tablet, Take 1 tablet (15 mg total) by mouth daily., Disp: 30 tablet, Rfl: 0   Objective:     There were no vitals filed for this visit.    There is no height or weight on file to calculate BMI.    Physical Exam:    ***   Electronically signed by:  [name] [name] 4:39 PM 06/02/21

## 2021-06-03 ENCOUNTER — Ambulatory Visit: Payer: Managed Care, Other (non HMO) | Admitting: Sports Medicine

## 2021-06-28 ENCOUNTER — Other Ambulatory Visit: Payer: Self-pay

## 2021-06-28 ENCOUNTER — Ambulatory Visit: Payer: Managed Care, Other (non HMO) | Admitting: Sports Medicine

## 2021-06-28 VITALS — BP 144/92 | HR 74 | Ht 68.0 in | Wt 171.0 lb

## 2021-06-28 DIAGNOSIS — M9903 Segmental and somatic dysfunction of lumbar region: Secondary | ICD-10-CM | POA: Diagnosis not present

## 2021-06-28 DIAGNOSIS — M9902 Segmental and somatic dysfunction of thoracic region: Secondary | ICD-10-CM

## 2021-06-28 DIAGNOSIS — M9908 Segmental and somatic dysfunction of rib cage: Secondary | ICD-10-CM

## 2021-06-28 DIAGNOSIS — M25511 Pain in right shoulder: Secondary | ICD-10-CM

## 2021-06-28 MED ORDER — MELOXICAM 15 MG PO TABS: 15.0000 mg | ORAL_TABLET | Freq: Every day | ORAL | 0 refills | Status: AC

## 2021-06-28 NOTE — Patient Instructions (Addendum)
Good to see you  Restart Meloxicam 15 mg  Shoulder Home Exercise plan  Work note 2 week follow up

## 2021-06-28 NOTE — Progress Notes (Signed)
[name] [address] [place] [address] Phone: [phone]   Assessment and Plan:     1. Acute pain of right shoulder 2. Somatic dysfunction of thoracic region 3. Somatic dysfunction of lumbar region 4. Somatic dysfunction of rib region -Acute with exacerbation, subsequent visit - Patient had significant relief with meloxicam and Flexeril prior to her travels to [place], however pain returned about 1 week ago - Restart meloxicam 15 mg daily x2 weeks - No muscle spasms, so fully discontinue Flexeril use - Start HEP for shoulder and rotator cuff - Work note provided to excuse patient from work last week when she had acute flare and stating that she is currently undergoing ongoing care by our clinic. - Patient elects for OMT today.  Tolerated well per note below. - Decision today to treat with OMT was based on Physical Exam  After verbal consent patient was treated with HVLA (high velocity low amplitude), ME (muscle energy), FPR (flex positional release), ST (soft tissue)techniques in rib, thoracic, lumbar, areas. Patient tolerated the procedure well with improvement in symptoms.  Patient educated on potential side effects of soreness and recommended to rest, hydrate, and use Tylenol as needed for pain control.    Pertinent previous records reviewed include none   Follow Up: 2 weeks for reevaluation.  Could consider x-ray shoulder, subacromial CSI versus additional OMT at that time   Subjective:   I, Debbe Odea, am serving as a scribe for Dr. Richardean Sale  Chief Complaint: Right shoulder pain follow up   HPI:     06/01/21 Patient is a 55 year old female presenting with bilateral shoulder pain that has been going on since Friday. Patient states no MOI woke up Friday morning with the pain. Patient locates pain to the right shoulder blade that will radiate down the lateral right side, across the top of her back to the  left shoulder and down the lateral left side. Patient states the pain also goes up into her neck. Patient is unable to turn or twist, walks and sit or turn by moving her whole body, patient has be taking ibuprofen, aleve, ice and heat.   06/28/21 Patient states shoulder is doing better, had a flare up last week was really bad she was not able to go to work for 3 days, muscle relaxer worked. Still tender today   Relevant Historical Information: Hypertension, HIV  Additional pertinent review of systems negative.   Current Outpatient Medications:    cyclobenzaprine (FLEXERIL) 5 MG tablet, Take 1 tablet (5 mg total) by mouth 3 (three) times daily as needed for muscle spasms., Disp: 30 tablet, Rfl: 0   [name] 50-25 MG TABS, TAKE 1 TABLET BY MOUTH EVERY DAY WITH BREAKFAST, Disp: 30 tablet, Rfl: 5   levothyroxine (SYNTHROID) 175 MCG tablet, levothyroxine sodium 175 mcg tabs, Disp: , Rfl:    meloxicam (MOBIC) 15 MG tablet, Take 1 tablet (15 mg total) by mouth daily., Disp: 14 tablet, Rfl: 0   Objective:     Vitals:   06/28/21 1345  BP: (!) 144/92  Pulse: 74  SpO2: 99%  Weight: 171 lb (77.6 kg)  Height: 5\' 8"  (1.727 m)      Body mass index is 26 kg/m.    Physical Exam:    Gen: Appears well, nad, nontoxic and pleasant Neuro:sensation intact, strength is 5/5 with df/pf/inv/ev, muscle tone wnl Skin: no suspicious lesion or defmority Psych: A&O, appropriate mood and affect  Right shoulder: no deformity, swelling or muscle wasting No scapular winging FF 160, abd 160, int 20, ext 70 TTP trapezius NTTP over the Starbuck, clavicle, ac, coracoid, biceps groove, humerus, deltoid, cervical spine Mildly positive neer, hawkings, empty can, subscap liftoff, speeds, obriens, crossarm Neg ant drawer, sulcus sign, apprehension Negative Spurling's test bilat FROM of neck    OMT Physical Exam:  Rib: Ribs 3-5 inhalation dysfunction on right Thoracic: TTP paraspinal, T3-5 RRSL Lumbar: TTP paraspinal,  T10-L2 RRSL  Electronically signed by:  [name] [name] 2:50 PM 06/28/21

## 2021-07-12 ENCOUNTER — Ambulatory Visit (INDEPENDENT_AMBULATORY_CARE_PROVIDER_SITE_OTHER): Payer: Managed Care, Other (non HMO) | Admitting: Sports Medicine

## 2021-07-12 ENCOUNTER — Other Ambulatory Visit: Payer: Self-pay

## 2021-07-12 VITALS — BP 150/84 | HR 88 | Ht 68.0 in | Wt 175.0 lb

## 2021-07-12 DIAGNOSIS — M7062 Trochanteric bursitis, left hip: Secondary | ICD-10-CM | POA: Diagnosis not present

## 2021-07-12 DIAGNOSIS — G5702 Lesion of sciatic nerve, left lower limb: Secondary | ICD-10-CM | POA: Diagnosis not present

## 2021-07-12 DIAGNOSIS — M7632 Iliotibial band syndrome, left leg: Secondary | ICD-10-CM | POA: Diagnosis not present

## 2021-07-12 NOTE — Patient Instructions (Addendum)
Good to see you Work note  IT/ piriformis HEP  2 week follow up

## 2021-07-12 NOTE — Progress Notes (Signed)
[facility]anna [facility] [address] [place] [address] Phone: [phone]   Assessment and Plan:     1. Greater trochanteric bursitis of left hip 2. It band syndrome, left 3. Piriformis syndrome of left side -Acute, initial visit - Likely acute strain of gluteal muscles, piriformis leading to greater trochanteric bursitis and left IT band syndrome from patient wearing high heels - Patient elected for greater trochanteric CSI.  Tolerated well per note below - Start HEP for piriformis, IT band - Work note provided that patient should work from home due to difficulty with walking until 07/23/2021 until reevaluated  Procedure: Greater trochanteric bursal injection Side: Left  Risks explained and consent was given verbally. The site was cleaned with alcohol prep. A steroid injection was performed with patient in the lateral side-lying position at area of maximum tenderness over greater trochanter using 57mL of 1% lidocaine without epinephrine and 62mL of kenalog 40mg /ml. This was well tolerated and resulted in symptomatic relief.  Needle was removed, hemostasis achieved, and post injection instructions were explained.  Pt was advised to call or return to clinic if these symptoms worsen or fail to improve as anticipated.     Pertinent previous records reviewed include none   Follow Up: 2 weeks for reevaluation.   Subjective:   I, , am serving as a Jerene Canny for Doctor Neurosurgeon  Chief Complaint: Left hip and back pain   HPI:    07/12/21 Patient states that she noticed the pain in her Left hip about 2 weeks ago. Patient states that she wore heals two days in a row and having to walk a lot while wearing them and the following day noticed the pain. Patient locates pain to the left lower back and shoots down the front of her L leg. Patient describes the pain as throbbing and shooting. Patient has been taking Advil, using heat  and ice, and Voltaren gel.   Relevant Historical Information: Hypertension, HIV  Additional pertinent review of systems negative.   Current Outpatient Medications:    cyclobenzaprine (FLEXERIL) 5 MG tablet, Take 1 tablet (5 mg total) by mouth 3 (three) times daily as needed for muscle spasms., Disp: 30 tablet, Rfl: 0   [name] 50-25 MG TABS, TAKE 1 TABLET BY MOUTH EVERY DAY WITH BREAKFAST, Disp: 30 tablet, Rfl: 5   levothyroxine (SYNTHROID) 175 MCG tablet, levothyroxine sodium 175 mcg tabs, Disp: , Rfl:    meloxicam (MOBIC) 15 MG tablet, Take 1 tablet (15 mg total) by mouth daily., Disp: 14 tablet, Rfl: 0   Objective:     Vitals:   07/12/21 1424  BP: (!) 150/84  Pulse: 88  SpO2: 97%  Weight: 175 lb (79.4 kg)  Height: 5\' 8"  (1.727 m)      Body mass index is 26.61 kg/m.    Physical Exam:    General: awake, alert, and oriented no acute distress, nontoxic Skin: no suspicious lesions or rashes Neuro:sensation intact distally with no dificits, normal muscle tone, no atrophy, strength 5/5 in all tested lower ext groups Psych: normal mood and affect, speech clear  Left hip: No deformity, swelling or wasting ROM Fexion 90, ext 30, IR 35, ER 35 TTP greater trochanter, gluteal musculature, IT band NTTP over the hip flexors,   si joint, lumbar spine Negative log roll with FROM Negative FABER Positive left FADIR with gluteal pain Positive left piriformis test Positive left trendelenberg Gait favoring right leg     Electronically  signed by:  Brianna [facility]eks D.[facility] 2:45 PM 07/12/21

## 2021-07-15 ENCOUNTER — Encounter: Payer: Self-pay | Admitting: *Deleted

## 2021-07-15 ENCOUNTER — Other Ambulatory Visit: Payer: Self-pay | Admitting: Sports Medicine

## 2021-07-15 ENCOUNTER — Telehealth: Payer: Self-pay | Admitting: Sports Medicine

## 2021-07-15 NOTE — Telephone Encounter (Signed)
error 

## 2021-07-16 ENCOUNTER — Telehealth: Payer: Self-pay

## 2021-07-16 NOTE — Telephone Encounter (Signed)
Patient called needed the letter for her work addended. Letter re-written and emailed to patient.

## 2021-07-30 ENCOUNTER — Telehealth: Payer: Self-pay | Admitting: Sports Medicine

## 2021-07-30 ENCOUNTER — Encounter: Payer: Self-pay | Admitting: Sports Medicine

## 2021-07-30 NOTE — Telephone Encounter (Signed)
Last work note written 07/16/2021, expiring.  Pt requesting a new letter with the ONLY restriction being: Unlocking doors to aid with ease of ingress and egress   For another 2 weeks.  I mentioned a recheck might be needed, pt advised she is overall doing better and will recheck in 2 weeks if not 100%.  I will email to her address on file

## 2021-07-30 NOTE — Telephone Encounter (Signed)
Note was written and emailed by front desk to patient

## 2021-07-30 NOTE — Telephone Encounter (Signed)
Update, original letter will expire 08/03/21. Would like new letter to extend 2 weeks from that date.

## 2021-11-08 ENCOUNTER — Other Ambulatory Visit: Payer: Self-pay | Admitting: Infectious Diseases

## 2021-11-08 DIAGNOSIS — B20 Human immunodeficiency virus [HIV] disease: Secondary | ICD-10-CM

## 2021-11-26 ENCOUNTER — Ambulatory Visit: Payer: Managed Care, Other (non HMO) | Admitting: Infectious Diseases

## 2021-11-26 ENCOUNTER — Other Ambulatory Visit (HOSPITAL_COMMUNITY)
Admission: RE | Admit: 2021-11-26 | Discharge: 2021-11-26 | Disposition: A | Discharge status: Home / Self Care | Payer: Managed Care, Other (non HMO) | Source: Ambulatory Visit | Attending: Infectious Diseases | Admitting: Infectious Diseases

## 2021-11-26 ENCOUNTER — Other Ambulatory Visit: Payer: Self-pay

## 2021-11-26 VITALS — BP 151/85 | HR 76 | Resp 16 | Ht 68.0 in | Wt 176.0 lb

## 2021-11-26 DIAGNOSIS — Z113 Encounter for screening for infections with a predominantly sexual mode of transmission: Secondary | ICD-10-CM | POA: Diagnosis not present

## 2021-11-26 DIAGNOSIS — I10 Essential (primary) hypertension: Secondary | ICD-10-CM

## 2021-11-26 DIAGNOSIS — Z79899 Other long term (current) drug therapy: Secondary | ICD-10-CM

## 2021-11-26 DIAGNOSIS — B20 Human immunodeficiency virus [HIV] disease: Secondary | ICD-10-CM | POA: Insufficient documentation

## 2021-11-26 DIAGNOSIS — E890 Postprocedural hypothyroidism: Secondary | ICD-10-CM

## 2021-11-26 NOTE — Progress Notes (Signed)
? ?  Subjective:  ? ? Patient ID: Brianna Weeks, female  DOB: Jun 15, 1966, 56 y.o.        MRN: [id number] ? ? ?HPI ?56 yo [place] F with HIV+ and Graves disease. Recieved I-131 (07-21-08).   ?Atrpla/DTGV --> odefsey/tivicay --> juluca (2019)   ?Has done at [facility].  Has had this year.   ?Kids are 55 yo [facility]), 56 yo (grad from [employer]).   ?  ?No problems with juluca.  ? ?She is not sure what is happening with her thyroid. Worried that her tea is interacting with her rx. She has repeat testing pending.  ?She is exercising and eats vegan.  ?Last BP was normal.  ? ?HIV 1 RNA Quant  ?Date Value  ?02/02/2021 <20 Copies/mL (H)  ?02/11/2020 <20 NOT DETECTED copies/mL  ?02/05/2019 <20 DETECTED copies/mL (A)  ? ?CD4 T Cell Abs (/uL)  ?Date Value  ?02/02/2021 1,321  ?02/11/2020 1,435  ?02/05/2019 1,136  ? ? ? ?Health Maintenance  ?Topic Date Due  ? TETANUS/TDAP  Never done  ? Zoster Vaccines- Shingrix (1 of 2) Never done  ? PAP SMEAR-Modifier  04/07/2016  ? MAMMOGRAM  06/17/2016  ? COVID-19 Vaccine (3 - Pfizer risk series) 01/11/2020  ? INFLUENZA VACCINE  02/22/2022  ? COLONOSCOPY (Pts 45-17yrs Insurance coverage will need to be confirmed)  07/25/2025  ? Hepatitis C Screening  Completed  ? HIV Screening  Completed  ? HPV VACCINES  Aged Out  ? ? ? ? ?Review of Systems  ?Constitutional:  Negative for chills, fever and weight loss.  ?Respiratory:  Negative for cough and shortness of breath.   ?Cardiovascular:  Negative for chest pain.  ?Gastrointestinal:  Negative for constipation and diarrhea.  ?Genitourinary:  Negative for dysuria.  ?     Amenorrheic.   ?Neurological:  Negative for headaches.  ? ?Please see HPI. All other systems reviewed and negative. ? ?   ?Objective:  ?Physical Exam ?Vitals reviewed.  ?Constitutional:   ?   General: She is not in acute distress. ?   Appearance: She is not toxic-appearing.  ?HENT:  ?   Mouth/Throat:  ?   Mouth: Mucous membranes are moist.  ?   Pharynx: No oropharyngeal exudate.   ?Eyes:  ?   Extraocular Movements: Extraocular movements intact.  ?   Pupils: Pupils are equal, round, and reactive to light.  ?Cardiovascular:  ?   Rate and Rhythm: Normal rate and regular rhythm.  ?Pulmonary:  ?   Effort: Pulmonary effort is normal.  ?   Breath sounds: Normal breath sounds.  ?Abdominal:  ?   General: Bowel sounds are normal. There is no distension.  ?   Palpations: Abdomen is soft.  ?   Tenderness: There is no abdominal tenderness.  ?Musculoskeletal:     ?   General: Normal range of motion.  ?   Cervical back: Normal range of motion and neck supple.  ?   Right lower leg: No edema.  ?   Left lower leg: No edema.  ?Neurological:  ?   General: No focal deficit present.  ?Psychiatric:     ?   Mood and Affect: Mood normal.  ? ? ? ? ? ? ?   ?Assessment & Plan:  ? ?

## 2021-11-26 NOTE — Addendum Note (Signed)
Addended by: Harley Alto on: 11/26/2021 09:56 AM ? ? Modules accepted: Orders ? ?

## 2021-11-26 NOTE — Assessment & Plan Note (Signed)
She is doing well ?Gets gyn care at non-[facility] site.  ?Her vax are uptodate.  ?Will continue on juluca.  ?Offered/refused condoms.  ?Will see her back in 9 months ?Check her labs today, she emails me for results.  ?

## 2021-11-26 NOTE — Assessment & Plan Note (Signed)
She has f/u with endo.  ?Will check his TSH.  ?

## 2021-11-26 NOTE — Assessment & Plan Note (Signed)
She is asx and wants to defer rx.  ?In our clinic she has had multiple abn.  ?

## 2021-11-27 LAB — URINALYSIS, ROUTINE W REFLEX MICROSCOPIC
Bilirubin Urine: NEGATIVE
Glucose, UA: NEGATIVE
Hgb urine dipstick: NEGATIVE
Ketones, ur: NEGATIVE
Leukocytes,Ua: NEGATIVE
Nitrite: NEGATIVE
Protein, ur: NEGATIVE
Specific Gravity, Urine: 1.016 (ref 1.001–1.035)
pH: 6 (ref 5.0–8.0)

## 2021-11-29 LAB — URINE CYTOLOGY ANCILLARY ONLY
Chlamydia: NEGATIVE
Comment: NEGATIVE
Comment: NEGATIVE
Comment: NORMAL
Neisseria Gonorrhea: NEGATIVE
Trichomonas: NEGATIVE

## 2021-11-30 LAB — CBC
HCT: 40.3 % (ref 35.0–45.0)
Hemoglobin: 12.7 g/dL (ref 11.7–15.5)
MCH: 27.7 pg (ref 27.0–33.0)
MCHC: 31.5 g/dL — ABNORMAL LOW (ref 32.0–36.0)
MCV: 88 fL (ref 80.0–100.0)
MPV: 11.3 fL (ref 7.5–12.5)
Platelets: 294 10*3/uL (ref 140–400)
RBC: 4.58 10*6/uL (ref 3.80–5.10)
RDW: 12.6 % (ref 11.0–15.0)
WBC: 6.1 10*3/uL (ref 3.8–10.8)

## 2021-11-30 LAB — COMPREHENSIVE METABOLIC PANEL
AG Ratio: 1.7 (calc) (ref 1.0–2.5)
ALT: 17 U/L (ref 6–29)
AST: 18 U/L (ref 10–35)
Albumin: 4.5 g/dL (ref 3.6–5.1)
Alkaline phosphatase (APISO): 63 U/L (ref 37–153)
BUN/Creatinine Ratio: 11 (calc) (ref 6–22)
BUN: 12 mg/dL (ref 7–25)
CO2: 27 mmol/L (ref 20–32)
Calcium: 10.2 mg/dL (ref 8.6–10.4)
Chloride: 105 mmol/L (ref 98–110)
Creat: 1.06 mg/dL — ABNORMAL HIGH (ref 0.50–1.03)
Globulin: 2.7 g/dL (calc) (ref 1.9–3.7)
Glucose, Bld: 81 mg/dL (ref 65–99)
Potassium: 3.8 mmol/L (ref 3.5–5.3)
Sodium: 144 mmol/L (ref 135–146)
Total Bilirubin: 0.4 mg/dL (ref 0.2–1.2)
Total Protein: 7.2 g/dL (ref 6.1–8.1)

## 2021-11-30 LAB — RPR: RPR Ser Ql: NONREACTIVE

## 2021-11-30 LAB — HIV-1 RNA QUANT-NO REFLEX-BLD
HIV 1 RNA Quant: NOT DETECTED copies/mL
HIV-1 RNA Quant, Log: NOT DETECTED Log copies/mL

## 2021-11-30 LAB — T-HELPER CELLS (CD4) COUNT (NOT AT ARMC)
Absolute CD4: 1323 cells/uL (ref 490–1740)
CD4 T Helper %: 44 % (ref 30–61)
Total lymphocyte count: 2982 cells/uL (ref 850–3900)

## 2021-11-30 LAB — TSH: TSH: 0.36 mIU/L — ABNORMAL LOW (ref 0.40–4.50)

## 2021-12-01 ENCOUNTER — Other Ambulatory Visit: Payer: Self-pay | Admitting: Infectious Diseases

## 2021-12-01 DIAGNOSIS — B20 Human immunodeficiency virus [HIV] disease: Secondary | ICD-10-CM

## 2021-12-26 ENCOUNTER — Encounter: Payer: Self-pay | Admitting: Infectious Diseases

## 2022-02-04 ENCOUNTER — Ambulatory Visit: Payer: Managed Care, Other (non HMO) | Admitting: Infectious Diseases

## 2022-04-13 NOTE — Progress Notes (Signed)
[facility] [address] [place] [address] Phone: [phone] Subjective:   Brianna Weeks, am serving as a scribe for Dr. Antoine Primas.  I'm seeing this patient by the request  of:  Laurann Montana, MD  CC: left knee pai nand swelling   [id number]  Brianna Weeks is a 56 y.o. female coming in with complaint of L knee pain. Last seen in 2022. Patient states bilateral knee pain. Swelling in both, but right is worse than left. Hurts with knee flexion. Has been taking Aleve and using a topical cream. Both help, but would like other recommendations. Pain described as pressure or discomfort, not sharp.        Past Medical History:  Diagnosis Date   Anemia    HIV infection (HCC)    Thyroid disease    No past surgical history on file. Social History   Socioeconomic History   Marital status: Unknown    Spouse name: Not on file   Number of children: Not on file   Years of education: Not on file   Highest education level: Not on file  Occupational History   Not on file  Tobacco Use   Smoking status: Never   Smokeless tobacco: Never  Substance and Sexual Activity   Alcohol use: No    Alcohol/week: 0.0 standard drinks of alcohol   Drug use: No   Sexual activity: Not Currently    Partners: Male    Comment: DECLINED CONDOMS  Other Topics Concern   Not on file  Social History Narrative   ** Merged History Encounter **       Social Determinants of Health   Financial Resource Strain: Not on file  Food Insecurity: Not on file  Transportation Needs: Not on file  Physical Activity: Not on file  Stress: Not on file  Social Connections: Not on file   Allergies  Allergen Reactions   Ciprofloxacin Nausea And Vomiting   Family History  Problem Relation Age of Onset   Hypertension Mother     Current Outpatient Medications (Endocrine & Metabolic):    levothyroxine (SYNTHROID) 175 MCG tablet, levothyroxine sodium 175 mcg  tabs    Current Outpatient Medications (Analgesics):    meloxicam (MOBIC) 15 MG tablet, Take 1 tablet (15 mg total) by mouth daily.   Current Outpatient Medications (Other):    cyclobenzaprine (FLEXERIL) 5 MG tablet, Take 1 tablet (5 mg total) by mouth 3 (three) times daily as needed for muscle spasms.   [name] 50-25 MG tablet, TAKE 1 TABLET BY MOUTH EVERY DAY WITH BREAKFAST   Reviewed prior external information including notes and imaging from  primary care provider As well as notes that were available from care everywhere and other healthcare systems.  Past medical history, social, surgical and family history all reviewed in electronic medical record.  No pertanent information unless stated regarding to the chief complaint.   Review of Systems:  No headache, visual changes, nausea, vomiting, diarrhea, constipation, dizziness, abdominal pain, skin rash, fevers, chills, night sweats, weight loss, swollen lymph nodes, body aches, chest pain, shortness of breath, mood changes. POSITIVE muscle aches, joint swelling  Objective  Blood pressure 120/74, pulse 78, height 5\' 8"  (1.727 m), weight 177 lb (80.3 kg), last menstrual period 05/04/2014, SpO2 96 %.   General: No apparent distress alert and oriented x3 mood and affect normal, dressed appropriately.  HEENT: Pupils equal, extraocular movements intact  Respiratory: Patient's speak in full sentences and does not appear  short of breath  Cardiovascular: No lower extremity edema, non tender, no erythema  Knee exam shows patient does have trace effusion of the knees bilaterally.  Tender to palpation more over the patellofemoral joint and the lateral joint lines.  Crepitus noted of the knees bilaterally.  After informed written and verbal consent, patient was seated on exam table. Right knee was prepped with alcohol swab and utilizing anterolateral approach, patient's right knee space was injected with 4:1  marcaine 0.5%: Kenalog 40mg /dL.  Patient tolerated the procedure well without immediate complications.  After informed written and verbal consent, patient was seated on exam table. Left knee was prepped with alcohol swab and utilizing anterolateral approach, patient's left knee space was injected with 4:1  marcaine 0.5%: Kenalog 40mg /dL. Patient tolerated the procedure well without immediate complications.    Impression and Recommendations:    The above documentation has been reviewed and is accurate and complete Lyndal Pulley, DO

## 2022-04-15 ENCOUNTER — Ambulatory Visit: Payer: Managed Care, Other (non HMO) | Admitting: Family Medicine

## 2022-04-15 DIAGNOSIS — M1710 Unilateral primary osteoarthritis, unspecified knee: Secondary | ICD-10-CM

## 2022-04-15 NOTE — Assessment & Plan Note (Signed)
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  Seems to be right greater than left.  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  Discussed icing regimen and home exercises.  Injections given today.  Patient should do relatively well with this.  Could be a candidate for viscosupplementation.  Follow-up in 6 weeks if not improved significantly we will consider the possibility of formal physical therapy as well. 

## 2022-04-15 NOTE — Patient Instructions (Signed)
Bilateral knee injections today Good to see you!  Do prescribed exercises at least 3x a week

## 2022-05-17 NOTE — Progress Notes (Unsigned)
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  Seems to be right greater than left.  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  Discussed icing regimen and home exercises.  Injections given today.  Patient should do relatively well with this.  Could be a candidate for viscosupplementation.  Follow-up in 6 weeks if not improved significantly we will consider the possibility of formal physical therapy as well.  Updated 05/23/2022 Brianna Weeks is a 56 y.o. female coming in with complaint of knee pain.  Patient was given injections at last exam.  Patient was to continue with the home exercises and icing regimen. Patient states that the left knee is doing alright but the right one is not. States the right knee is about the same as it has been since before the injection.    Ask about her thyroid If not better get knee x-rays    Past Medical History:  Diagnosis Date   Anemia    HIV infection (HCC)    Thyroid disease    No past surgical history on file. Social History   Socioeconomic History   Marital status: Unknown    Spouse name: Not on file   Number of children: Not on file   Years of education: Not on file   Highest education level: Not on file  Occupational History   Not on file  Tobacco Use   Smoking status: Never   Smokeless tobacco: Never  Substance and Sexual Activity   Alcohol use: No    Alcohol/week: 0.0 standard drinks of alcohol   Drug use: No   Sexual activity: Not Currently    Partners: Male    Comment: DECLINED CONDOMS  Other Topics Concern   Not on file  Social History Narrative   ** Merged History Encounter **       Social Determinants of Health   Financial Resource Strain: Not on file  Food Insecurity: Not  on file  Transportation Needs: Not on file  Physical Activity: Not on file  Stress: Not on file  Social Connections: Not on file   Allergies  Allergen Reactions   Ciprofloxacin Nausea And Vomiting   Family History  Problem Relation Age of Onset   Hypertension Mother     Current Outpatient Medications (Endocrine & Metabolic):    levothyroxine (SYNTHROID) 175 MCG tablet, levothyroxine sodium 175 mcg tabs    Current Outpatient Medications (Analgesics):    meloxicam (MOBIC) 15 MG tablet, Take 1 tablet (15 mg total) by mouth daily.   Current Outpatient Medications (Other):    cyclobenzaprine (FLEXERIL) 5 MG tablet, Take 1 tablet (5 mg total) by mouth 3 (three) times daily as needed for muscle spasms.   [name] 50-25 MG tablet, TAKE 1 TABLET BY MOUTH EVERY DAY WITH BREAKFAST   Reviewed prior external information including notes and imaging from  primary care provider As well as notes that were available from care everywhere and other healthcare systems.  Past medical history, social, surgical and family history all reviewed in electronic medical record.  No pertanent information unless stated regarding to the chief complaint.   Review of Systems:  No headache, visual changes, nausea, vomiting, diarrhea, constipation, dizziness, abdominal pain, skin rash, fevers, chills, night sweats, weight loss, swollen lymph nodes, body aches, joint  swelling, chest pain, shortness of breath, mood changes. POSITIVE muscle aches  Objective  Last menstrual period 05/04/2014.   General: No apparent distress alert and oriented x3 mood and affect normal, dressed appropriately.  HEENT: Pupils equal, extraocular movements intact  Respiratory: Patient's speak in full sentences and does not appear short of breath  Cardiovascular: No lower extremity edema, non tender, no erythema  Knee exam shows    Impression and Recommendations:     The above documentation has been reviewed and is accurate and  complete Lyndal Pulley, DO

## 2022-05-23 ENCOUNTER — Ambulatory Visit: Payer: Managed Care, Other (non HMO) | Admitting: Family Medicine

## 2022-05-23 ENCOUNTER — Ambulatory Visit (INDEPENDENT_AMBULATORY_CARE_PROVIDER_SITE_OTHER): Payer: Managed Care, Other (non HMO)

## 2022-05-23 VITALS — BP 142/80 | HR 83 | Ht 68.0 in | Wt 175.0 lb

## 2022-05-23 DIAGNOSIS — G8929 Other chronic pain: Secondary | ICD-10-CM

## 2022-05-23 DIAGNOSIS — M2351 Chronic instability of knee, right knee: Secondary | ICD-10-CM

## 2022-05-23 DIAGNOSIS — M25561 Pain in right knee: Secondary | ICD-10-CM

## 2022-05-23 NOTE — Patient Instructions (Addendum)
Good to see you Get X-ray on your way out MRI [facility] [phone] We will be in touch

## 2022-05-24 DIAGNOSIS — M2351 Chronic instability of knee, right knee: Secondary | ICD-10-CM | POA: Insufficient documentation

## 2022-05-24 NOTE — Assessment & Plan Note (Signed)
Patient is having worsening right knee instability.  Patient states that it locks daily at this time.  Patient has failed meloxicam 15 mg daily.  Do feel patient failing this, home exercises, physical therapy do feel advanced imaging is warranted, will get MRI to see the extent and then discuss treatment afterward

## 2022-06-05 ENCOUNTER — Other Ambulatory Visit: Payer: Managed Care, Other (non HMO)

## 2022-06-08 ENCOUNTER — Ambulatory Visit
Admission: RE | Admit: 2022-06-08 | Discharge: 2022-06-08 | Disposition: A | Discharge status: Home / Self Care | Payer: Managed Care, Other (non HMO) | Source: Ambulatory Visit | Attending: Family Medicine | Admitting: Family Medicine

## 2022-06-08 DIAGNOSIS — G8929 Other chronic pain: Secondary | ICD-10-CM

## 2022-06-10 ENCOUNTER — Telehealth: Payer: Self-pay | Admitting: *Deleted

## 2022-06-10 ENCOUNTER — Ambulatory Visit: Payer: Managed Care, Other (non HMO) | Admitting: Family Medicine

## 2022-06-10 VITALS — Ht 68.0 in

## 2022-06-10 DIAGNOSIS — S83281A Other tear of lateral meniscus, current injury, right knee, initial encounter: Secondary | ICD-10-CM

## 2022-06-10 DIAGNOSIS — S83241A Other tear of medial meniscus, current injury, right knee, initial encounter: Secondary | ICD-10-CM

## 2022-06-10 NOTE — Progress Notes (Signed)
Called patient today to discuss MRI report.  Patient wants to be referred to for the medial and lateral meniscal tears.

## 2022-06-10 NOTE — Telephone Encounter (Signed)
Pt called stating she received her MRI results with Dr. Michaelle Copas comments but she would like a call back to discuss further.

## 2022-06-10 NOTE — Telephone Encounter (Signed)
Left message for patient to call back  

## 2022-08-19 ENCOUNTER — Other Ambulatory Visit: Payer: Self-pay | Admitting: Infectious Diseases

## 2022-08-19 DIAGNOSIS — B20 Human immunodeficiency virus [HIV] disease: Secondary | ICD-10-CM

## 2022-11-29 ENCOUNTER — Other Ambulatory Visit (INDEPENDENT_AMBULATORY_CARE_PROVIDER_SITE_OTHER): Payer: Managed Care, Other (non HMO)

## 2022-11-29 ENCOUNTER — Other Ambulatory Visit (HOSPITAL_COMMUNITY)
Admission: RE | Admit: 2022-11-29 | Discharge: 2022-11-29 | Disposition: A | Discharge status: Home / Self Care | Payer: Managed Care, Other (non HMO) | Source: Ambulatory Visit | Attending: Internal Medicine | Admitting: Internal Medicine

## 2022-11-29 ENCOUNTER — Encounter: Payer: Managed Care, Other (non HMO) | Admitting: Infectious Diseases

## 2022-11-29 DIAGNOSIS — B20 Human immunodeficiency virus [HIV] disease: Secondary | ICD-10-CM

## 2022-11-30 LAB — URINE CYTOLOGY ANCILLARY ONLY
Chlamydia: NEGATIVE
Comment: NEGATIVE
Comment: NORMAL
Neisseria Gonorrhea: NEGATIVE

## 2022-11-30 LAB — T-HELPER CELLS (CD4) COUNT (NOT AT ARMC)
CD4 % Helper T Cell: 45 % (ref 33–65)
CD4 T Cell Abs: 1314 /uL (ref 400–1790)

## 2022-12-01 LAB — HIV-1 RNA QUANT-NO REFLEX-BLD: HIV-1 RNA Viral Load: <20 copies/mL

## 2022-12-01 LAB — RPR: RPR Ser Ql: NONREACTIVE

## 2022-12-14 ENCOUNTER — Ambulatory Visit: Payer: Managed Care, Other (non HMO) | Admitting: Infectious Diseases

## 2022-12-14 ENCOUNTER — Encounter: Payer: Self-pay | Admitting: Infectious Diseases

## 2022-12-14 VITALS — BP 126/71 | HR 67 | Temp 98.5°F | Wt 176.7 lb

## 2022-12-14 DIAGNOSIS — I10 Essential (primary) hypertension: Secondary | ICD-10-CM

## 2022-12-14 DIAGNOSIS — B20 Human immunodeficiency virus [HIV] disease: Secondary | ICD-10-CM | POA: Diagnosis not present

## 2022-12-14 DIAGNOSIS — E890 Postprocedural hypothyroidism: Secondary | ICD-10-CM | POA: Diagnosis not present

## 2022-12-14 DIAGNOSIS — Z7184 Encounter for health counseling related to travel: Secondary | ICD-10-CM

## 2022-12-14 DIAGNOSIS — E7800 Pure hypercholesterolemia, unspecified: Secondary | ICD-10-CM | POA: Diagnosis not present

## 2022-12-14 NOTE — Assessment & Plan Note (Signed)
Lab Results  Component Value Date   CHOL 213 (H) 02/02/2021   HDL 80 02/02/2021   LDLCALC 112 (H) 02/02/2021   TRIG 100 02/02/2021   CHOLHDL 2.7 02/02/2021    Will continue to watch  Appreciate her PCP and endo f/u.  She is borderline The 10-year ASCVD risk score (Arnett DK, et al., 2019) is: 1.8%   Values used to calculate the score:     Age: 57 years     Sex: Female     Is Non-Hispanic African American: No     Diabetic: No     Tobacco smoker: No     Systolic Blood Pressure: 126 mmHg     Is BP treated: No     HDL Cholesterol: 80 mg/dL     Total Cholesterol: 213 mg/dL

## 2022-12-14 NOTE — Assessment & Plan Note (Signed)
She is doing well No change in meds We discussed cabaneuva (she is waiting til it is q6 months or q56months).  She is single, not active.  Appreciate GYN f/u Discussed PCV 20, at 57 yo.  Rtc in 12 months.

## 2022-12-14 NOTE — Assessment & Plan Note (Signed)
Appreciate Endo, PCP f/u States she has repeat labs pending.  Neck- non-tender, no swelling/enlargement.

## 2022-12-14 NOTE — Progress Notes (Signed)
   Subjective:    Patient ID: Marquie Excell, female  DOB: 1966-03-31, 57 y.o.        MRN: [id number]   HPI 57 yo [place] F with HIV+ and Graves disease. Recieved I-131 (07-21-08).  Had endo f/u 10-2022 and was doing well (TSH at goal after dose change). Has f/u 12-15-22.  Atrpla/DTGV --> odefsey/tivicay --> juluca (2019)   [facility]- mammo and pap today.   Kids are 13 yo graduated [employer]), 57 yo (grad from [employer]).    She is doing well. Kids are doing well. No problems with ART.   HIV 1 RNA Quant  Date Value  11/26/2021 NOT DETECTED copies/mL  02/02/2021 <20 Copies/mL (H)  02/11/2020 <20 NOT DETECTED copies/mL   HIV-1 RNA Viral Load (copies/mL)  Date Value  11/29/2022 <20   CD4 T Cell Abs (/uL)  Date Value  11/29/2022 1,314  02/02/2021 1,321  02/11/2020 1,435     Health Maintenance  Topic Date Due  . DTaP/Tdap/Td (1 - Tdap) Never done  . Zoster Vaccines- Shingrix (1 of 2) Never done  . PAP SMEAR-Modifier  04/07/2016  . MAMMOGRAM  06/17/2016  . COVID-19 Vaccine (3 - Pfizer risk series) 01/11/2020  . INFLUENZA VACCINE  02/23/2023  . COLONOSCOPY (Pts 45-24yrs Insurance coverage will need to be confirmed)  07/25/2025  . Hepatitis C Screening  Completed  . HIV Screening  Completed  . HPV VACCINES  Aged Out      Review of Systems  Constitutional:  Negative for chills, fever and weight loss.  Respiratory:  Negative for cough and shortness of breath.   Cardiovascular:  Negative for chest pain.  Gastrointestinal:  Negative for constipation and diarrhea.  Genitourinary:  Negative for dysuria.  Neurological:  Negative for headaches.   Please see HPI. All other systems reviewed and negative.     Objective:  Physical Exam Vitals reviewed.  Constitutional:      Appearance: Normal appearance.  HENT:     Mouth/Throat:     Mouth: Mucous membranes are moist.     Pharynx: No oropharyngeal exudate.  Eyes:     Extraocular Movements: Extraocular movements  intact.     Pupils: Pupils are equal, round, and reactive to light.  Cardiovascular:     Rate and Rhythm: Normal rate and regular rhythm.  Pulmonary:     Effort: Pulmonary effort is normal.     Breath sounds: Normal breath sounds.  Abdominal:     General: Bowel sounds are normal. There is no distension.     Palpations: Abdomen is soft.     Tenderness: There is no abdominal tenderness.  Musculoskeletal:        General: Normal range of motion.     Cervical back: Normal range of motion and neck supple.     Right lower leg: No edema.     Left lower leg: No edema.  Neurological:     General: No focal deficit present.     Mental Status: She is alert.  Psychiatric:        Mood and Affect: Mood normal.          Assessment & Plan:

## 2022-12-14 NOTE — Assessment & Plan Note (Signed)
Will repeat her BP today (sl up which she attributes to walking to clinic) On no rx asx

## 2022-12-15 DIAGNOSIS — Z7184 Encounter for health counseling related to travel: Secondary | ICD-10-CM | POA: Insufficient documentation

## 2022-12-15 NOTE — Assessment & Plan Note (Signed)
Pt states she is returning to [place] next week.  She was given a rx for what sounds like mefloquine (weekly rx, start prior to trip, continue post) though she did not know the exact name.  Will be available for any issues

## 2022-12-20 ENCOUNTER — Encounter: Payer: Managed Care, Other (non HMO) | Admitting: Infectious Diseases

## 2023-05-01 ENCOUNTER — Other Ambulatory Visit: Payer: Self-pay | Admitting: Infectious Diseases

## 2023-05-01 DIAGNOSIS — B20 Human immunodeficiency virus [HIV] disease: Secondary | ICD-10-CM

## 2023-10-31 NOTE — Progress Notes (Deleted)
    Brianna [facility] D.[facility] [address] [place] [address] Phone: [phone]   Assessment and Plan:     There are no diagnoses linked to this encounter.  ***   Pertinent previous records reviewed include ***    Follow Up: ***     Subjective:   I, Brianna Weeks, am serving as a Neurosurgeon for Brianna Weeks  Chief Complaint: bilat knee pain   HPI:   11/01/2023 Patient is a 58 year old female with bilat knee pain. Patient states   Relevant Historical Information: ***  Additional pertinent review of systems negative.   Current Outpatient Medications:    cyclobenzaprine (FLEXERIL) 5 MG tablet, Take 1 tablet (5 mg total) by mouth 3 (three) times daily as needed for muscle spasms., Disp: 30 tablet, Rfl: 0   [name] 50-25 MG tablet, TAKE 1 TABLET BY MOUTH EVERY DAY WITH BREAKFAST, Disp: 30 tablet, Rfl: 8   levothyroxine (SYNTHROID) 175 MCG tablet, levothyroxine sodium 175 mcg tabs, Disp: , Rfl:    meloxicam (MOBIC) 15 MG tablet, Take 1 tablet (15 mg total) by mouth daily., Disp: 14 tablet, Rfl: 0   Objective:     There were no vitals filed for this visit.    There is no height or weight on file to calculate BMI.    Physical Exam:    ***   Electronically signed by:  Brianna [facility]eks D.[facility] 7:37 AM 10/31/23

## 2023-11-01 ENCOUNTER — Ambulatory Visit: Admitting: Sports Medicine

## 2023-12-08 ENCOUNTER — Telehealth: Payer: Self-pay | Admitting: Infectious Diseases

## 2023-12-08 NOTE — Telephone Encounter (Signed)
 Please see the previous message :   This patient has been set up for an appt.

## 2023-12-08 NOTE — Telephone Encounter (Signed)
 Please see the previous message :  This patient has been set up for an appt.  Copied from CRM [id number]. Topic: Appointments - Appointment Scheduling >> Dec 07, 2023 10:55 AM Madelyne Schiff wrote: Patient would like to set up an appt with Dr Alwin Baars of infectious disease. Please advise

## 2023-12-08 NOTE — Telephone Encounter (Signed)
 Please see the previous message.  The patient has been sch.  Copied from CRM [id number]. Topic: Appointments - Scheduling Inquiry for Clinic >> Dec 07, 2023  4:25 PM Brynn Caras wrote: Reason for CRM: The patient called previously this morning to schedule her 1 year OV with Dr. Alwin Baars, she states she has not received a callback yet. Advised someone from the clinic will follow-up directly to continue the scheduling process. Patient has been notified that the [facility] will be located at [address] [place], [place] [address] as of 12/11/2023. Callback #:[phone]

## 2023-12-08 NOTE — Telephone Encounter (Signed)
 [facility] Appt has been sch and to call back to verify the following appointments:  Name: Zeba, Cerio MRN: [id number]  Date: 01/03/2024 Status: Sch  Time: 11:00 AM Length: 30  Visit Type: LAB 30 [446] Copay: $0.00  Provider: [facility]       Name: Dinelle, Sudo MRN: [id number]  Date: 01/17/2024 Status: Sch  Time: 2:15 PM Length: 15  Visit Type: [facility] INFECTIOUS DISEASE [2216] Copay: $25.00  Provider: Sandie Cross, MD       Copied from CRM [id number]. Topic: Appointments - Scheduling Inquiry for Clinic >> Dec 07, 2023  4:25 PM Brynn Caras wrote: Reason for CRM: The patient called previously this morning to schedule her 1 year OV with Dr. Alwin Baars, she states she has not received a callback yet. Advised someone from the clinic will follow-up directly to continue the scheduling process. Patient has been notified that the [facility] will be located at [address] [place], [place] [address] as of 12/11/2023. Callback #[phone]

## 2023-12-14 ENCOUNTER — Ambulatory Visit: Admitting: Family Medicine

## 2024-01-03 ENCOUNTER — Other Ambulatory Visit

## 2024-01-15 ENCOUNTER — Telehealth: Payer: Self-pay | Admitting: *Deleted

## 2024-01-15 NOTE — Telephone Encounter (Signed)
 Left message for patient not to come to Dr. Loa appointment on 01/16/24 at 2:15 PM.  We will put the new appointment in mail.

## 2024-01-16 ENCOUNTER — Encounter: Admitting: Infectious Diseases

## 2024-01-17 ENCOUNTER — Encounter: Admitting: Infectious Diseases

## 2024-02-05 ENCOUNTER — Other Ambulatory Visit: Payer: Self-pay | Admitting: Infectious Diseases

## 2024-02-05 DIAGNOSIS — B20 Human immunodeficiency virus [HIV] disease: Secondary | ICD-10-CM

## 2024-02-13 ENCOUNTER — Other Ambulatory Visit: Payer: Self-pay | Admitting: Infectious Diseases

## 2024-02-13 DIAGNOSIS — Z79899 Other long term (current) drug therapy: Secondary | ICD-10-CM

## 2024-02-13 DIAGNOSIS — B20 Human immunodeficiency virus [HIV] disease: Secondary | ICD-10-CM

## 2024-02-13 DIAGNOSIS — Z113 Encounter for screening for infections with a predominantly sexual mode of transmission: Secondary | ICD-10-CM

## 2024-02-13 NOTE — Addendum Note (Signed)
 Addended by: ANTONE DWAYNE SAILOR on: 02/13/2024 04:11 PM   Modules accepted: Orders

## 2024-02-14 ENCOUNTER — Other Ambulatory Visit (HOSPITAL_COMMUNITY)
Admission: RE | Admit: 2024-02-14 | Discharge: 2024-02-14 | Disposition: A | Discharge status: Home / Self Care | Source: Ambulatory Visit | Attending: Internal Medicine | Admitting: Internal Medicine

## 2024-02-14 ENCOUNTER — Other Ambulatory Visit

## 2024-02-14 DIAGNOSIS — Z113 Encounter for screening for infections with a predominantly sexual mode of transmission: Secondary | ICD-10-CM

## 2024-02-14 DIAGNOSIS — Z79899 Other long term (current) drug therapy: Secondary | ICD-10-CM

## 2024-02-14 DIAGNOSIS — B20 Human immunodeficiency virus [HIV] disease: Secondary | ICD-10-CM

## 2024-02-15 LAB — URINE CYTOLOGY ANCILLARY ONLY
Chlamydia: NEGATIVE
Comment: NEGATIVE
Comment: NORMAL
Neisseria Gonorrhea: NEGATIVE

## 2024-02-16 LAB — COMPREHENSIVE METABOLIC PANEL WITH GFR
ALT: 16 IU/L (ref 0–32)
AST: 20 IU/L (ref 0–40)
Albumin: 4.2 g/dL (ref 3.8–4.9)
Alkaline Phosphatase: 85 IU/L (ref 44–121)
BUN/Creatinine Ratio: 12 (ref 9–23)
BUN: 12 mg/dL (ref 6–24)
Bilirubin Total: <0.2 mg/dL (ref 0.0–1.2)
CO2: 21 mmol/L (ref 20–29)
Calcium: 9.6 mg/dL (ref 8.7–10.2)
Chloride: 104 mmol/L (ref 96–106)
Creatinine, Ser: 1.04 mg/dL — ABNORMAL HIGH (ref 0.57–1.00)
Globulin, Total: 2.6 g/dL (ref 1.5–4.5)
Glucose: 82 mg/dL (ref 70–99)
Potassium: 4.1 mmol/L (ref 3.5–5.2)
Sodium: 141 mmol/L (ref 134–144)
Total Protein: 6.8 g/dL (ref 6.0–8.5)
eGFR: 62 mL/min/1.73 (ref >59)

## 2024-02-16 LAB — LIPID PANEL
Chol/HDL Ratio: 2.3 ratio (ref 0.0–4.4)
Cholesterol, Total: 172 mg/dL (ref 100–199)
HDL: 75 mg/dL (ref >39)
LDL Chol Calc (NIH): 85 mg/dL (ref 0–99)
Triglycerides: 64 mg/dL (ref 0–149)
VLDL Cholesterol Cal: 12 mg/dL (ref 5–40)

## 2024-02-16 LAB — T-HELPER CELLS (CD4) COUNT (NOT AT ARMC)
CD4 % Helper T Cell: 44 % (ref 33–65)
CD4 T Cell Abs: 1091 /uL (ref 400–1790)

## 2024-02-16 LAB — HIV-1 RNA QUANT-NO REFLEX-BLD: HIV-1 RNA Viral Load: <20 {copies}/mL

## 2024-02-16 LAB — CBC
Hematocrit: 40.7 % (ref 34.0–46.6)
Hemoglobin: 12.8 g/dL (ref 11.1–15.9)
MCH: 28.2 pg (ref 26.6–33.0)
MCHC: 31.4 g/dL — ABNORMAL LOW (ref 31.5–35.7)
MCV: 90 fL (ref 79–97)
Platelets: 237 x10E3/uL (ref 150–450)
RBC: 4.54 x10E6/uL (ref 3.77–5.28)
RDW: 13.9 % (ref 11.7–15.4)
WBC: 6.3 x10E3/uL (ref 3.4–10.8)

## 2024-02-16 LAB — RPR: RPR Ser Ql: NONREACTIVE

## 2024-02-28 ENCOUNTER — Other Ambulatory Visit

## 2024-03-05 ENCOUNTER — Other Ambulatory Visit: Payer: Self-pay

## 2024-03-05 ENCOUNTER — Ambulatory Visit: Admitting: Infectious Diseases

## 2024-03-05 ENCOUNTER — Encounter: Payer: Self-pay | Admitting: Infectious Diseases

## 2024-03-05 VITALS — BP 150/70 | HR 70 | Temp 98.1°F | Ht 68.0 in | Wt 175.8 lb

## 2024-03-05 DIAGNOSIS — I10 Essential (primary) hypertension: Secondary | ICD-10-CM

## 2024-03-05 DIAGNOSIS — Z113 Encounter for screening for infections with a predominantly sexual mode of transmission: Secondary | ICD-10-CM

## 2024-03-05 DIAGNOSIS — B20 Human immunodeficiency virus [HIV] disease: Secondary | ICD-10-CM

## 2024-03-05 DIAGNOSIS — E785 Hyperlipidemia, unspecified: Secondary | ICD-10-CM

## 2024-03-05 DIAGNOSIS — E890 Postprocedural hypothyroidism: Secondary | ICD-10-CM | POA: Diagnosis not present

## 2024-03-05 NOTE — Progress Notes (Signed)
 Subjective:    Patient ID: Brianna Weeks, female  DOB: Aug 13, 1965, 58 y.o.        MRN: [id number]   HPI 58 yo [place] F with HIV+ and Graves disease. Recieved I-131 (07-21-08).  Had endo f/u 2025 and was doing well (TSH at goal after dose change). Has f/u 12-15-22.  Atrpla/DTGV --> odefsey/tivicay  --> juluca  (2019)   [facility]- mammo and pap (11-2023).   Kids are 50 yo graduated [employer]), 58 yo (grad from [employer]).     She is doing well. Kids are doing well.  She has been having issues with her BP- no chest pain, no trouble breathing. No headaches.  Mother had this as well.   HIV 1 RNA Quant  Date Value  11/26/2021 NOT DETECTED copies/mL  02/02/2021 <20 Copies/mL (H)  02/11/2020 <20 NOT DETECTED copies/mL   HIV-1 RNA Viral Load (copies/mL)  Date Value  02/14/2024 <20  11/29/2022 <20   CD4 T Cell Abs (/uL)  Date Value  02/14/2024 1,091  11/29/2022 1,314  02/02/2021 1,321     Health Maintenance  Topic Date Due  . DTaP/Tdap/Td (1 - Tdap) Never done  . Zoster Vaccines- Shingrix (1 of 2) Never done  . Hepatitis B Vaccines (3 of 3 - 19+ 3-dose series) 11/12/2007  . Cervical Cancer Screening (Pap smear)  04/07/2016  . MAMMOGRAM  06/17/2016  . COVID-19 Vaccine (7 - 2024-25 season) 03/26/2023  . INFLUENZA VACCINE  02/23/2024  . Pneumococcal Vaccine: 19-49 Years (4 of 4 - PCV20 or PCV21) 02/10/2025  . Pneumococcal Vaccine: 50+ Years (4 of 4 - PCV20 or PCV21) 02/10/2025  . Colonoscopy  07/25/2025  . Hepatitis C Screening  Completed  . HIV Screening  Completed  . HPV VACCINES  Aged Out  . Meningococcal B Vaccine  Aged Out      Review of Systems  Constitutional:  Negative for chills, fever and weight loss.  Eyes:  Negative for blurred vision (yearly eye exam (02-2024).  Respiratory:  Negative for shortness of breath.   Cardiovascular:  Negative for chest pain and leg swelling.  Gastrointestinal:  Negative for constipation and diarrhea.  Genitourinary:   Negative for dysuria.  Neurological:  Negative for headaches.    Please see HPI. All other systems reviewed and negative.     Objective:  Physical Exam Vitals reviewed.  Constitutional:      Appearance: Normal appearance.  HENT:     Mouth/Throat:     Mouth: Mucous membranes are moist.     Pharynx: No oropharyngeal exudate.  Eyes:     Extraocular Movements: Extraocular movements intact.     Pupils: Pupils are equal, round, and reactive to light.  Cardiovascular:     Rate and Rhythm: Normal rate and regular rhythm.  Pulmonary:     Effort: Pulmonary effort is normal.     Breath sounds: Normal breath sounds.  Abdominal:     General: Bowel sounds are normal. There is no distension.     Palpations: Abdomen is soft.     Tenderness: There is no abdominal tenderness.  Musculoskeletal:     Cervical back: Normal range of motion and neck supple.     Right lower leg: No edema.     Left lower leg: No edema.  Neurological:     General: No focal deficit present.     Mental Status: She is alert.  Psychiatric:        Mood and Affect: Mood normal.  Assessment & Plan:

## 2024-03-05 NOTE — Assessment & Plan Note (Signed)
 She is doing well Current recs are for all HIV+ to be on statin.  We broached this and will re-discuss at next visit.  The 10-year ASCVD risk score (Arnett DK, et al., 2019) is: 2.5%   Values used to calculate the score:     Age: 58 years     Clincally relevant sex: Female     Is Non-Hispanic African American: No     Diabetic: No     Tobacco smoker: No     Systolic Blood Pressure: 150 mmHg     Is BP treated: No     HDL Cholesterol: 75 mg/dL     Total Cholesterol: 172 mg/dL

## 2024-03-05 NOTE — Assessment & Plan Note (Signed)
 She is asx I'll defer to Dr Teresa Encouraged pt to do home monitoring which she states she will do.

## 2024-03-05 NOTE — Assessment & Plan Note (Addendum)
 She is doing very well Next PCV due 2026 No change in her meds. We discussed new med options, at this point she does not want to change.  She is leaving her job, have reached out to [employer] to help her get ADAP.   Colon uptodate.  Rtc in 9 months.

## 2024-03-05 NOTE — Assessment & Plan Note (Signed)
 She appears to be well By her hx, she is stable on her levothyroxine  dose.

## 2024-03-13 ENCOUNTER — Encounter: Admitting: Infectious Diseases

## 2024-03-26 ENCOUNTER — Telehealth: Payer: Self-pay | Admitting: *Deleted

## 2024-03-26 NOTE — Telephone Encounter (Signed)
 Copied from CRM [id number]. Topic: General - Other >> Mar 26, 2024  3:45 PM Brianna Weeks ORN wrote: Reason for CRM: Patient called in requesting that Dr. Loa nurse give her a call. CB #: [phone].

## 2024-03-26 NOTE — Telephone Encounter (Signed)
 Return call to pt who stated Dr Eben had sent her an e-mail. She will be losing her insurance on Friday and he will get in touch w/Michelle so she can continue receiving her medications. And she has not heard from anyone. I told her I will ask Dr Eben about this.

## 2024-03-28 NOTE — Telephone Encounter (Signed)
 Called pt - no answer; left message on pt's vm Michelle telephone # [phone].

## 2024-05-29 ENCOUNTER — Other Ambulatory Visit: Payer: Self-pay | Admitting: Infectious Diseases

## 2024-05-29 DIAGNOSIS — B20 Human immunodeficiency virus [HIV] disease: Secondary | ICD-10-CM

## 2024-05-29 NOTE — Telephone Encounter (Unsigned)
 Copied from CRM [id number]. Topic: Clinical - Medication Refill >> May 29, 2024  3:23 PM Mercer PEDLAR wrote: Medication: [name]  50-25 MG tablet  Has the patient contacted their pharmacy? Yes (Agent: If no, request that the patient contact the pharmacy for the refill. If patient does not wish to contact the pharmacy document the reason why and proceed with request.) (Agent: If yes, when and what did the pharmacy advise?)  This is the patient's preferred pharmacy:  [id number] [address], [place] - [address] [place] [address] Phone: [phone] Fax: [phone]  Is this the correct pharmacy for this prescription? Yes If no, delete pharmacy and type the correct one.   Has the prescription been filled recently? No  Is the patient out of the medication? No  Has the patient been seen for an appointment in the last year OR does the patient have an upcoming appointment? Yes  Can we respond through MyChart? Yes  Agent: Please be advised that Rx refills may take up to 3 business days. We ask that you follow-up with your pharmacy.

## 2024-05-30 MED ORDER — JULUCA 50-25 MG PO TABS: 1.0000 | ORAL_TABLET | Freq: Every day | ORAL | 3 refills | Status: DC

## 2024-07-15 ENCOUNTER — Telehealth: Payer: Self-pay | Admitting: *Deleted

## 2024-07-15 DIAGNOSIS — B20 Human immunodeficiency virus [HIV] disease: Secondary | ICD-10-CM

## 2024-07-15 MED ORDER — JULUCA 50-25 MG PO TABS: 1.0000 | ORAL_TABLET | Freq: Every day | ORAL | 3 refills | Status: AC

## 2024-07-15 MED ORDER — JULUCA 50-25 MG PO TABS: 1.0000 | ORAL_TABLET | Freq: Every day | ORAL | 3 refills | Status: DC

## 2024-07-15 NOTE — Telephone Encounter (Signed)
 I re-sent [name]  rx x2. Now I see a confirmation [facility] was [facility] at [facility] @ 1511PM. I called the pt to let her know and to call back for any problems.

## 2024-07-15 NOTE — Telephone Encounter (Signed)
 I called [facility] who stated they did not received [name]  rx on 11/6 even though we received confirmation. I re-send rx per [facility] request.

## 2024-07-15 NOTE — Telephone Encounter (Signed)
 Copied from CRM #[id number]. Topic: Clinical - Prescription Issue >> Jul 15, 2024  1:09 PM DeAngela L wrote: Reason for CRM: Patient calling after [facility] to [facility] and the pharmacy state they don't have a refill available and asked the patient to call the office but the [place] can see refills available for the patient but the patient is not sure why the pharmacy is saying they don't have a refill for her but she is out of medication at this time and asking to pick the refill as soon as possible   dolutegravir -rilpivirine  ([name] ) 50-25 MG tablet  [id number] [address], [place] - [address] [place] [address] Phone: [phone] Fax: [phone]  Pt num [phone]

## 2024-07-15 NOTE — Addendum Note (Signed)
 Addended by: CAMMIE GAETANA DEL on: 07/15/2024 02:40 PM   Modules accepted: Orders
